# Patient Record
Sex: Male | Born: 1970 | Race: Black or African American | Hispanic: No | Marital: Married | Smoking: Never smoker
Health system: Southern US, Community
[De-identification: ages and names within clinical notes are randomized; demographics above are authoritative.]

## PROBLEM LIST (undated history)

## (undated) DIAGNOSIS — Z789 Other specified health status: Secondary | ICD-10-CM

## (undated) DIAGNOSIS — E119 Type 2 diabetes mellitus without complications: Secondary | ICD-10-CM

## (undated) HISTORY — DX: Other specified health status: Z78.9

## (undated) HISTORY — DX: Type 2 diabetes mellitus without complications: E11.9

## (undated) HISTORY — PX: NO PAST SURGERIES: SHX2092

---

## 2005-08-16 ENCOUNTER — Emergency Department (HOSPITAL_COMMUNITY): Admission: EM | Admit: 2005-08-16 | Discharge: 2005-08-16 | Payer: Self-pay | Admitting: Emergency Medicine

## 2005-12-07 ENCOUNTER — Emergency Department (HOSPITAL_COMMUNITY): Admission: EM | Admit: 2005-12-07 | Discharge: 2005-12-08 | Payer: Self-pay | Admitting: Emergency Medicine

## 2013-06-20 ENCOUNTER — Ambulatory Visit: Payer: Self-pay | Admitting: Family Medicine

## 2013-06-20 VITALS — BP 116/84 | HR 92 | Temp 99.0°F | Resp 18 | Ht 69.5 in | Wt 178.0 lb

## 2013-06-20 DIAGNOSIS — J309 Allergic rhinitis, unspecified: Secondary | ICD-10-CM

## 2013-06-20 DIAGNOSIS — R3911 Hesitancy of micturition: Secondary | ICD-10-CM

## 2013-06-20 MED ORDER — TAMSULOSIN HCL 0.4 MG PO CAPS: 0.4000 mg | ORAL_CAPSULE | Freq: Every day | ORAL | Status: DC

## 2013-06-20 MED ORDER — FLUTICASONE PROPIONATE 50 MCG/ACT NA SUSP: 2.0000 | Freq: Every day | NASAL | Status: DC

## 2013-06-20 NOTE — Progress Notes (Signed)
This chart was scribed for Elvina SidleKurt Lauenstein, MD, by Yevette EdwardsAngela Bracken, ED Scribe. This patient's care was started at 5:51 PM.   Patient ID: Javier [name], male   DOB: 03/25/1970, 43 y.o.   MRN: [id number]      Patient Name: Javier [name] Date of Birth: 12/29/1970 Medical Record Number: [id number] Gender: male Date of Encounter: 06/20/2013  Chief Complaint: Nasal Congestion, Urinary Frequency and Erectile Dysfunction   History of Present Illness:  Javier [name] is a 43 y.o. very pleasant male patient who presents with the following: nasal congestion which has persisted for over a year and has been associated with dyspnea and an increased gag-response. He reports that laying upon his stomach increases his dyspnea. He has had a few episodes of epistaxis.   He also complains of difficulty urinating. The pt also complains of minimal abdominal pain. He denies dysuria.   The pt is from [place].   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    There are no active problems to display for this patient.  History reviewed. No pertinent past medical history. History reviewed. No pertinent past surgical history. History  Substance Use Topics   Smoking status: Never Smoker    Smokeless tobacco: Not on file   Alcohol Use: Not on file   History reviewed. No pertinent family history. No Known Allergies  Medication list has been reviewed and updated.  No current outpatient prescriptions on file prior to visit.   No current facility-administered medications on file prior to visit.    Review of Systems:  Positive: Nasal congestion; SOB; abdominal pain; difficulty urinating Negative: dysuria   Physical Examination: Filed Vitals:   06/20/13 1738  BP: 116/84  Pulse: 92  Temp: 99 F (37.2 C)  Resp: 18   @vitals2 @ Body mass index is 25.92 kg/(m^2). Ideal Body Weight: @FLOWAMB ([id number])@([phone])@  HENT: Mild bilateral nasal passage  swelling. Oropharynx clear.  Neck: Normal neck exam, no adenopathy.  Pulmonary: Chest is clear Cardiovascular: Heart regular, no murmur Abdomen: Abdomen soft and non-tender.   EKG / Labs / Xrays: None available at time of encounter  Assessment and Plan:  Will prescribe pt a nasal spray. Informed pt to follow-up with him in a month to assess efficacy of treatment.    Yevette EdwardsAngela Bracken, ED Scribe  Allergic rhinitis - Plan: fluticasone (FLONASE) 50 MCG/ACT nasal spray  Urinary hesitancy - Plan: tamsulosin (FLOMAX) 0.4 MG CAPS capsule  Signed, Elvina SidleKurt Lauenstein, MD

## 2013-06-20 NOTE — Patient Instructions (Signed)
Allergic Rhinitis Allergic rhinitis is when the mucous membranes in the nose respond to allergens. Allergens are particles in the air that cause your body to have an allergic reaction. This causes you to release allergic antibodies. Through a chain of events, these eventually cause you to release histamine into the blood stream. Although meant to protect the body, it is this release of histamine that causes your discomfort, such as frequent sneezing, congestion, and an itchy, runny nose.  CAUSES  Seasonal allergic rhinitis (hay fever) is caused by pollen allergens that may come from grasses, trees, and weeds. Year-round allergic rhinitis (perennial allergic rhinitis) is caused by allergens such as house dust mites, pet dander, and mold spores.  SYMPTOMS   Nasal stuffiness (congestion).  Itchy, runny nose with sneezing and tearing of the eyes. DIAGNOSIS  Your health care provider can help you determine the allergen or allergens that trigger your symptoms. If you and your health care provider are unable to determine the allergen, skin or blood testing may be used. TREATMENT  Allergic Rhinitis does not have a cure, but it can be controlled by:  Medicines and allergy shots (immunotherapy).  Avoiding the allergen. Hay fever may often be treated with antihistamines in pill or nasal spray forms. Antihistamines block the effects of histamine. There are over-the-counter medicines that may help with nasal congestion and swelling around the eyes. Check with your health care provider before taking or giving this medicine.  If avoiding the allergen or the medicine prescribed do not work, there are many new medicines your health care provider can prescribe. Stronger medicine may be used if initial measures are ineffective. Desensitizing injections can be used if medicine and avoidance does not work. Desensitization is when a patient is given ongoing shots until the body becomes less sensitive to the allergen.  Make sure you follow up with your health care provider if problems continue. HOME CARE INSTRUCTIONS It is not possible to completely avoid allergens, but you can reduce your symptoms by taking steps to limit your exposure to them. It helps to know exactly what you are allergic to so that you can avoid your specific triggers. SEEK MEDICAL CARE IF:   You have a fever.  You develop a cough that does not stop easily (persistent).  You have shortness of breath.  You start wheezing.  Symptoms interfere with normal daily activities. Document Released: 11/20/2000 Document Revised: 12/16/2012 Document Reviewed: 11/02/2012 ExitCare Patient Information 2014 [employer].  

## 2013-06-21 ENCOUNTER — Other Ambulatory Visit: Payer: Self-pay | Admitting: *Deleted

## 2013-06-21 DIAGNOSIS — R3911 Hesitancy of micturition: Secondary | ICD-10-CM

## 2013-06-21 DIAGNOSIS — J309 Allergic rhinitis, unspecified: Secondary | ICD-10-CM

## 2013-06-21 MED ORDER — FLUTICASONE PROPIONATE 50 MCG/ACT NA SUSP: 2.0000 | Freq: Every day | NASAL | Status: DC

## 2013-06-21 MED ORDER — TAMSULOSIN HCL 0.4 MG PO CAPS: 0.4000 mg | ORAL_CAPSULE | Freq: Every day | ORAL | Status: DC

## 2013-09-06 ENCOUNTER — Ambulatory Visit (INDEPENDENT_AMBULATORY_CARE_PROVIDER_SITE_OTHER): Payer: Self-pay | Admitting: Emergency Medicine

## 2013-09-06 ENCOUNTER — Telehealth: Payer: Self-pay | Admitting: Emergency Medicine

## 2013-09-06 VITALS — BP 118/76 | HR 68 | Temp 98.8°F | Resp 16 | Ht 69.75 in | Wt 181.6 lb

## 2013-09-06 DIAGNOSIS — J309 Allergic rhinitis, unspecified: Secondary | ICD-10-CM

## 2013-09-06 DIAGNOSIS — N529 Male erectile dysfunction, unspecified: Secondary | ICD-10-CM

## 2013-09-06 DIAGNOSIS — R3911 Hesitancy of micturition: Secondary | ICD-10-CM

## 2013-09-06 MED ORDER — TAMSULOSIN HCL 0.4 MG PO CAPS: 0.4000 mg | ORAL_CAPSULE | Freq: Every day | ORAL | Status: DC

## 2013-09-06 MED ORDER — FLUTICASONE PROPIONATE 50 MCG/ACT NA SUSP: 2.0000 | Freq: Every day | NASAL | Status: DC

## 2013-09-06 MED ORDER — VARDENAFIL HCL 10 MG PO TBDP: ORAL_TABLET | ORAL | Status: DC

## 2013-09-06 NOTE — Patient Instructions (Signed)
Erectile Dysfunction Erectile dysfunction is the inability to get or sustain a good enough erection to have sexual intercourse. Erectile dysfunction may involve:  Inability to get an erection.  Lack of enough hardness to allow penetration.  Loss of the erection before sex is finished.  Premature ejaculation. CAUSES  Certain drugs, such as:  Pain relievers.  Antihistamines.  Antidepressants.  Blood pressure medicines.  Water pills (diuretics).  Ulcer medicines.  Muscle relaxants.  Illegal drugs.  Excessive drinking.  Psychological causes, such as:  Anxiety.  Depression.  Sadness.  Exhaustion.  Performance fear.  Stress.  Physical causes, such as:  Artery problems. This may include diabetes, smoking, liver disease, or atherosclerosis.  High blood pressure.  Hormonal problems, such as low testosterone.  Obesity.  Nerve problems. This may include back or pelvic injuries, diabetes mellitus, multiple sclerosis, or Parkinson disease. SYMPTOMS  Inability to get an erection.  Lack of enough hardness to allow penetration.  Loss of the erection before sex is finished.  Premature ejaculation.  Normal erections at some times, but with frequent unsatisfactory episodes.  Orgasms that are not satisfactory in sensation or frequency.  Low sexual satisfaction in either partner because of erection problems.  A curved penis occurring with erection. The curve may cause pain or may be too curved to allow for intercourse.  Never having nighttime erections. DIAGNOSIS Your caregiver can often diagnose this condition by:  Performing a physical exam to find other diseases or specific problems with the penis.  Asking you detailed questions about the problem.  Performing blood tests to check for diabetes mellitus or to measure hormone levels.  Performing urine tests to find other underlying health conditions.  Performing an ultrasound exam to check for  scarring.  Performing a test to check blood flow to the penis.  Doing a sleep study at home to measure nighttime erections. TREATMENT   You may be prescribed medicines by mouth.  You may be given medicine injections into the penis.  You may be prescribed a vacuum pump with a ring.  Penile implant surgery may be performed. You may receive:  An inflatable implant.  A semirigid implant.  Blood vessel surgery may be performed. HOME CARE INSTRUCTIONS  If you are prescribed oral medicine, you should take the medicine as prescribed. Do not increase the dosage without first discussing it with your physician.  If you are using self-injections, be careful to avoid any veins that are on the surface of the penis. Apply pressure to the injection site for 5 minutes.  If you are using a vacuum pump, make sure you have read the instructions before using it. Discuss any questions with your physician before taking the pump home. SEEK MEDICAL CARE IF:  You experience pain that is not responsive to the pain medicine you have been prescribed.  You experience nausea or vomiting. SEEK IMMEDIATE MEDICAL CARE IF:   When taking oral or injectable medications, you experience an erection that lasts longer than 4 hours. If your physician is unavailable, go to the nearest emergency room for evaluation. An erection that lasts much longer than 4 hours can result in permanent damage to your penis.  You have pain that is severe.  You develop redness, severe pain, or severe swelling of your penis.  You have redness spreading up into your groin or lower abdomen.  You are unable to pass your urine. Document Released: 02/23/2000 Document Revised: 10/28/2012 Document Reviewed: 07/30/2012 ExitCare Patient Information 2015 [employer]. This information is not   intended to replace advice given to you by your health care provider. Make sure you discuss any questions you have with your health care provider.  

## 2013-09-06 NOTE — Telephone Encounter (Signed)
States that he is at the pharmacy now and his Levitra is going to cost $300. States that there is a coupon that Dr. Dareen PianoAnderson mentioned to him that help lower the cost. Please advise   [phone]

## 2013-09-06 NOTE — Progress Notes (Signed)
[facility] [address], [place] [id number] [phone]  Date:  09/06/2013   Name:  Bernell ListHarouna Stantz   DOB:  06/18/1970   MRN:  [id number]  PCP:  No PCP Per Patient    Chief Complaint: Nasal Congestion   History of Present Illness:  Bernell ListHarouna Cantrall is a 43 y.o. very pleasant male patient who presents with the following:  Multiple issues.  Was treated in April for prostatism and seasonal allergic rhinitis.  Misunderstood the intent with the medication and did not refill them after the first month.  Say he is still having difficulties with allergic rhinitis symptoms. His prostatism is improved, sleeping the night thru without nocturia.  He has improved flow and decreased hesitancy.   Cannot achieve an erection suitable to complete intercourse. Denies other complaint or health concern today.   There are no active problems to display for this patient.   History reviewed. No pertinent past medical history.  History reviewed. No pertinent past surgical history.  History  Substance Use Topics  . Smoking status: Never Smoker   . Smokeless tobacco: Not on file  . Alcohol Use: Not on file    History reviewed. No pertinent family history.  No Known Allergies  Medication list has been reviewed and updated.  Current Outpatient Prescriptions on File Prior to Visit  Medication Sig Dispense Refill  . fluticasone (FLONASE) 50 MCG/ACT nasal spray Place 2 sprays into both nostrils daily.  16 g  6  . tamsulosin (FLOMAX) 0.4 MG CAPS capsule Take 1 capsule (0.4 mg total) by mouth daily.  30 capsule  3   No current facility-administered medications on file prior to visit.    Review of Systems:  As per HPI, otherwise negative.    Physical Examination: Filed Vitals:   09/06/13 1033  BP: 118/76  Pulse: 68  Temp: 98.8 F (37.1 C)  Resp: 16   Filed Vitals:   09/06/13 1033  Height: 5' 9.75" (1.772 m)  Weight: 181 lb 9.6 oz (82.373 kg)   Body mass index is  26.23 kg/(m^2). Ideal Body Weight: Weight in (lb) to have BMI = 25: 172.6   GEN: WDWN, NAD, Non-toxic, Alert & Oriented x 3 HEENT: Atraumatic, Normocephalic.  Ears and Nose: No external deformity. EXTR: No clubbing/cyanosis/edema NEURO: Normal gait.  PSYCH: Normally interactive. Conversant. Not depressed or anxious appearing.  Calm demeanor.    Assessment and Plan: ED Prostatism SAR   Signed,  Phillips OdorJeffery Anderson, MD

## 2013-09-07 NOTE — Telephone Encounter (Signed)
Advised pt to go to [email or web address] to print out the instant savings information. Pt states he understands.

## 2013-09-20 ENCOUNTER — Telehealth: Payer: Self-pay

## 2013-09-20 NOTE — Telephone Encounter (Signed)
Spoke to [employer]ara H. Regarding patient's message.  Then called patient and advised he does not try to purchase the medication online because he could get something other than what was prescribed to him.  He said he would keep trying pharmacies in the area until he found one with a generic.

## 2013-09-20 NOTE — Telephone Encounter (Signed)
Patient says none of the pharmacies he has tried have generic levaquin, he wants to know if he can purchase it online.

## 2014-05-09 ENCOUNTER — Ambulatory Visit (INDEPENDENT_AMBULATORY_CARE_PROVIDER_SITE_OTHER): Payer: PRIVATE HEALTH INSURANCE | Admitting: Family Medicine

## 2014-05-09 VITALS — BP 118/84 | HR 66 | Temp 97.9°F | Resp 18 | Ht 70.5 in | Wt 181.2 lb

## 2014-05-09 DIAGNOSIS — J3089 Other allergic rhinitis: Secondary | ICD-10-CM

## 2014-05-09 MED ORDER — PREDNISONE 20 MG PO TABS: ORAL_TABLET | ORAL | Status: DC

## 2014-05-09 MED ORDER — IPRATROPIUM BROMIDE 0.03 % NA SOLN: 2.0000 | Freq: Two times a day (BID) | NASAL | Status: DC

## 2014-05-09 NOTE — Patient Instructions (Signed)

## 2014-05-09 NOTE — Progress Notes (Signed)
This chart was scribed for Elvina SidleKurt Emidio Warrell, MD by Luisa DagoPriscilla Tutu, ED Scribe. This patient was seen in room 9 and the patient's care was started at 4:07 PM.  Patient ID: Javier [name] MRN: [id number], DOB: 04/21/1970, 44 y.o. Date of Encounter: 05/09/2014, 4:07 PM  Primary Physician: No PCP Per Patient  Chief Complaint: Nasal congestion  HPI: 44 y.o. year old male with history below presents with year long intermittent nasal congestion. Pt states that he was prescribed a nasal spray upon his last visit but it has not been working to alleviate his symptom. He states that when he woke up this morning, after blowing his nose he noticed some blood tinted sputum. Mr Arrie Aranntchi is also complaining of 6 month teary eye as well. Pt denies any  fever, neck pain, sore throat,  CP, cough, SOB, abdominal pain, nausea, emesis, diarrhea, urinary symptoms, back pain, HA, weakness, numbness and rash as associated symptoms.    Patient works in Set designermanufacturing  History reviewed. No pertinent past medical history.   Home Meds: Prior to Admission medications   Medication Sig Start Date End Date Taking? Authorizing Provider  fluticasone (FLONASE) 50 MCG/ACT nasal spray Place 2 sprays into both nostrils daily. Patient not taking: Reported on 05/09/2014 09/06/13   Carmelina DaneJeffery S Anderson, MD  tamsulosin (FLOMAX) 0.4 MG CAPS capsule Take 1 capsule (0.4 mg total) by mouth daily. Patient not taking: Reported on 05/09/2014 09/06/13   Carmelina DaneJeffery S Anderson, MD  Vardenafil HCl 10 MG TBDP 1 tablet po 30 minutes prior to intercourse. Patient not taking: Reported on 05/09/2014 09/06/13   Carmelina DaneJeffery S Anderson, MD    Allergies: No Known Allergies  History   Social History  . Marital Status: Married    Spouse Name: N/A  . Number of Children: N/A  . Years of Education: N/A   Occupational History  . Not on file.   Social History Main Topics  . Smoking status: Never Smoker   . Smokeless tobacco: Not on file  . Alcohol Use: Not on  file  . Drug Use: Not on file  . Sexual Activity: Not on file   Other Topics Concern  . Not on file   Social History Narrative     Review of Systems: Positive nasal congestion and watery eyes Constitutional: negative for chills, fever, night sweats, weight changes, or fatigue  HEENT: negative for vision changes, hearing loss,  rhinorrhea, ST, epistaxis, or sinus pressure Cardiovascular: negative for chest pain or palpitations Respiratory: negative for hemoptysis, wheezing, shortness of breath, or cough Abdominal: negative for abdominal pain, nausea, vomiting, diarrhea, or constipation Dermatological: negative for rash Neurologic: negative for headache, dizziness, or syncope All other systems reviewed and are otherwise negative with the exception to those above and in the HPI.   Physical Exam:  Blood pressure 118/84, pulse 66, temperature 97.9 F (36.6 C), temperature source Oral, resp. rate 18, height 5' 10.5" (1.791 m), weight 181 lb 3.2 oz (82.192 kg), SpO2 100 %., Body mass index is 25.62 kg/(m^2). General: Well developed, well nourished, in no acute distress. Head: Normocephalic, atraumatic, eyes without discharge, sclera non-icteric, nares are without discharge. Bilateral auditory canals clear, TM's are without perforation, pearly grey and translucent with reflective cone of light bilaterally. Clear rhinorrhea with mildly swollen nasal passage. OP is clear. No erythema or edema.  Neck: Supple. No thyromegaly. Full ROM. No lymphadenopathy. Lungs: Clear bilaterally to auscultation without wheezes, rales, or rhonchi. Breathing is unlabored. Heart: RRR with S1 S2. No murmurs, rubs, or  gallops appreciated. Abdomen: Soft, non-tender, non-distended with normoactive bowel sounds. No hepatomegaly. No rebound/guarding. No obvious abdominal masses. Msk:  Strength and tone normal for age. Extremities/Skin: Warm and dry. No clubbing or cyanosis. No edema. No rashes or suspicious  lesions. Neuro: Alert and oriented X 3. Moves all extremities spontaneously. Gait is normal. CNII-XII grossly in tact. Psych:  Responds to questions appropriately with a normal affect.    ASSESSMENT AND PLAN:  44 y.o. year old male with  This chart was scribed in my presence and reviewed by me personally.    ICD-9-CM ICD-10-CM   1. Other allergic rhinitis 477.8 J30.89 ipratropium (ATROVENT) 0.03 % nasal spray     predniSONE (DELTASONE) 20 MG tablet     Signed, Elvina Sidle, MD    Signed, Elvina Sidle, MD 05/09/2014 4:07 PM

## 2015-05-22 ENCOUNTER — Ambulatory Visit (INDEPENDENT_AMBULATORY_CARE_PROVIDER_SITE_OTHER): Payer: BLUE CROSS/BLUE SHIELD | Admitting: Physician Assistant

## 2015-05-22 VITALS — BP 116/92 | HR 91 | Temp 98.4°F | Resp 17 | Ht 70.5 in | Wt 182.0 lb

## 2015-05-22 DIAGNOSIS — R030 Elevated blood-pressure reading, without diagnosis of hypertension: Secondary | ICD-10-CM

## 2015-05-22 DIAGNOSIS — Z13220 Encounter for screening for lipoid disorders: Secondary | ICD-10-CM | POA: Diagnosis not present

## 2015-05-22 DIAGNOSIS — J309 Allergic rhinitis, unspecified: Secondary | ICD-10-CM

## 2015-05-22 DIAGNOSIS — IMO0001 Reserved for inherently not codable concepts without codable children: Secondary | ICD-10-CM

## 2015-05-22 LAB — COMPREHENSIVE METABOLIC PANEL
ALT: 20 U/L (ref 9–46)
AST: 17 U/L (ref 10–40)
Albumin: 4.7 g/dL (ref 3.6–5.1)
Alkaline Phosphatase: 42 U/L (ref 40–115)
BUN: 9 mg/dL (ref 7–25)
CHLORIDE: 103 mmol/L (ref 98–110)
CO2: 28 mmol/L (ref 20–31)
CREATININE: 0.86 mg/dL (ref 0.60–1.35)
Calcium: 9.9 mg/dL (ref 8.6–10.3)
GLUCOSE: 96 mg/dL (ref 65–99)
POTASSIUM: 5 mmol/L (ref 3.5–5.3)
SODIUM: 140 mmol/L (ref 135–146)
Total Bilirubin: 0.5 mg/dL (ref 0.2–1.2)
Total Protein: 8.1 g/dL (ref 6.1–8.1)

## 2015-05-22 LAB — CBC
HCT: 47.8 % (ref 39.0–52.0)
Hemoglobin: 16.6 g/dL (ref 13.0–17.0)
MCH: 32.4 pg (ref 26.0–34.0)
MCHC: 34.7 g/dL (ref 30.0–36.0)
MCV: 93.2 fL (ref 78.0–100.0)
MPV: 11.6 fL (ref 8.6–12.4)
PLATELETS: 275 10*3/uL (ref 150–400)
RBC: 5.13 MIL/uL (ref 4.22–5.81)
RDW: 13.3 % (ref 11.5–15.5)
WBC: 5.7 10*3/uL (ref 4.0–10.5)

## 2015-05-22 LAB — LIPID PANEL
CHOL/HDL RATIO: 3.4 ratio (ref <=5.0)
Cholesterol: 212 mg/dL — ABNORMAL HIGH (ref 125–200)
HDL: 63 mg/dL (ref >=40)
LDL CALC: 134 mg/dL — AB (ref <130)
Triglycerides: 76 mg/dL (ref <150)
VLDL: 15 mg/dL (ref <30)

## 2015-05-22 MED ORDER — CETIRIZINE HCL 10 MG PO TABS: 10.0000 mg | ORAL_TABLET | Freq: Every day | ORAL | Status: DC

## 2015-05-22 NOTE — Patient Instructions (Addendum)
IF you received an x-ray today, you will receive an invoice from [facility]. Please contact [facility] at [phone] with questions or concerns regarding your invoice.   IF you received labwork today, you will receive an invoice from [facility]. Please contact [facility] at [phone] with questions or concerns regarding your invoice.   Our billing staff will not be able to assist you with questions regarding bills from these companies.  You will be contacted with the lab results as soon as they are available. The fastest way to get your results is to activate your My Chart account. Instructions are located on the last page of this paperwork. If you have not heard from us regarding the results in 2 weeks, please contact this office.   I will call you with your lab results. Work on healthier eating - avoid fried and salty foods. Eat vegetables and fruits. Only drink water during the day. Exercise 3-4 days a week for at least 30 minutes at a time. Buy a BP monitor and take bp at home at rest 3-4 times a week and keep a record. Return in 3 months for follow up on your BP.  Take zyrtec daily for allergies. Take at night if makes you drowsy.

## 2015-05-22 NOTE — Progress Notes (Signed)
[facility] [address] [place] [address] [phone]  Date:  05/22/2015   Name:  Javier [name]   DOB:  01/15/1971   MRN:  [id number]  PCP:  No PCP Per Patient    Chief Complaint: Sinus Congestion and Blood Pressure Check   History of Present Illness:  This is a 45 y.o. male with PMH allergic rhinitis who is presenting with sinus congestion for several years. States he has the congestion year round. He also has watery eyes, esp in the morning. Eyes are not irritated and do not itch. He has tried atrovent nasal spray and flonase, each for 3 weeks at a time, and states neither worked. He has never tried oral antihistamines or other allergy medicine. Pt wondering if an abx might help him.  Pt also wondering about his BP. States gets checked at work occ and 130s/90s. Today 117/92. He does not eat well. Does not eat breakfast. For lunch has fried chicken. At dinner has rice and vegetables from a [employer]. Drinks only water. Has 4-5 alcoholic beverages one weekend day a week. Does not smoke. No rec drug use. Does not exercise regularly.  Review of Systems:  Review of Systems See HPI  There are no active problems to display for this patient.  Home meds: None  No Known Allergies  History reviewed. No pertinent past surgical history.  Social History  Substance Use Topics  . Smoking status: Never Smoker   . Smokeless tobacco: None  . Alcohol Use: None    History reviewed. No pertinent family history.  Medication list has been reviewed and updated.  Physical Examination:  Physical Exam  Constitutional: He is oriented to person, place, and time. He appears well-developed and well-nourished. No distress.  HENT:  Head: Normocephalic and atraumatic.  Right Ear: Hearing, external ear and ear canal normal. Tympanic membrane is retracted.  Left Ear: Hearing, external ear and ear canal normal. Tympanic membrane is retracted.  Nose: Mucosal  edema present. Right sinus exhibits no maxillary sinus tenderness and no frontal sinus tenderness. Left sinus exhibits no maxillary sinus tenderness and no frontal sinus tenderness.  Mouth/Throat: Uvula is midline, oropharynx is clear and moist and mucous membranes are normal.  Eyes: Conjunctivae and lids are normal. Right eye exhibits no discharge. Left eye exhibits no discharge. No scleral icterus.  Neck: Carotid bruit is not present. No thyromegaly present.  Cardiovascular: Normal rate, regular rhythm, normal heart sounds and normal pulses.   No murmur heard. Pulmonary/Chest: Effort normal and breath sounds normal. No respiratory distress. He has no wheezes. He has no rhonchi. He has no rales.  Musculoskeletal: Normal range of motion.  Lymphadenopathy:       Head (right side): No submental, no submandibular and no tonsillar adenopathy present.       Head (left side): No submental, no submandibular and no tonsillar adenopathy present.    He has no cervical adenopathy.  Neurological: He is alert and oriented to person, place, and time.  Skin: Skin is warm, dry and intact. No lesion and no rash noted.  Psychiatric: He has a normal mood and affect. His speech is normal and behavior is normal. Thought content normal.   BP 116/92 mmHg  Pulse 91  Temp(Src) 98.4 F (36.9 C) (Oral)  Resp 17  Ht 5' 10.5" (1.791 m)  Wt 182 lb (82.555 kg)  BMI 25.74 kg/m2  SpO2 95%  Assessment and Plan:  1. Elevated BP Diastolic only mildly elevated  to 90-92. Systolic 116-120. Will hold off on starting meds at this time. Pt does not exercise and does not eat a healthy diet. We discussed lifestyle changes. He will buy a bp monitor and monitor/record pressures 3-4 times a week. Return in 3 months for follow up. - CBC - Comprehensive metabolic panel  2. Lipid screening - Lipid panel  3. Allergic rhinitis, unspecified allergic rhinitis type Symptoms consistent with allergic rhinitis. Not currently taking any  meds. Start on zyrtec QD. Follow up in 3 months. - cetirizine (ZYRTEC) 10 MG tablet; Take 1 tablet (10 mg total) by mouth daily.  Dispense: 30 tablet; Refill: 11   Jone Panebianco V. Dyke BrackettBush, PA-C, MHS [facility]  05/22/2015

## 2015-06-01 ENCOUNTER — Encounter: Payer: Self-pay | Admitting: Family Medicine

## 2015-09-13 ENCOUNTER — Ambulatory Visit (INDEPENDENT_AMBULATORY_CARE_PROVIDER_SITE_OTHER): Payer: BLUE CROSS/BLUE SHIELD | Admitting: Physician Assistant

## 2015-09-13 VITALS — BP 138/82 | HR 100 | Temp 98.4°F | Resp 18 | Ht 70.5 in | Wt 181.0 lb

## 2015-09-13 DIAGNOSIS — J309 Allergic rhinitis, unspecified: Secondary | ICD-10-CM | POA: Diagnosis not present

## 2015-09-13 MED ORDER — MONTELUKAST SODIUM 10 MG PO TABS: 10.0000 mg | ORAL_TABLET | Freq: Every day | ORAL | Status: DC

## 2015-09-13 MED ORDER — OLOPATADINE HCL 0.1 % OP SOLN: 1.0000 [drp] | Freq: Two times a day (BID) | OPHTHALMIC | Status: DC

## 2015-09-13 MED ORDER — MOMETASONE FUROATE 50 MCG/ACT NA SUSP: 2.0000 | Freq: Every day | NASAL | Status: DC

## 2015-09-13 NOTE — Progress Notes (Signed)
Javier ListHarouna [name]  MRN: [id number] DOB: 09/20/1970  Subjective:  Pt presents to clinic for continued problems with nasal congestion and am watery eyes.  He has had this for over a year and he has been on Flonase, atrovent and zyrtec none of which have helped.  He is frustrated because he starts to feel better after he has not been a work for a few days in a row and then once he goes back to work the symptoms return.  He is interested in having a few days off of work to "feel alive".  He works with a lot of chemicals and he is not given a mask at work to wear unless they are having an inspection.  His symptoms started when he got this job.  Symptoms do not change when the seasons change.  Patient Active Problem [name]   Diagnosis Date Noted  . Rhinitis, allergic 05/22/2015  . Elevated BP 05/22/2015    No current outpatient prescriptions on file prior to visit.   No current facility-administered medications on file prior to visit.    No Known Allergies  Review of Systems  HENT: Positive for congestion. Negative for nosebleeds, postnasal drip and sore throat.   Eyes: Positive for discharge (watery only in the am). Negative for pain, redness, itching and visual disturbance.  Allergic/Immunologic: Positive for environmental allergies.   Objective:  BP 138/82 mmHg  Pulse 100  Temp(Src) 98.4 F (36.9 C) (Oral)  Resp 18  Ht 5' 10.5" (1.791 m)  Wt 181 lb (82.101 kg)  BMI 25.60 kg/m2  SpO2 97%  Physical Exam  Constitutional: He is oriented to person, place, and time and well-developed, well-nourished, and in no distress.  HENT:  Head: Normocephalic and atraumatic.  Right Ear: Hearing, tympanic membrane, external ear and ear canal normal.  Left Ear: Hearing, tympanic membrane, external ear and ear canal normal.  Nose: Mucosal edema (pale) present.  Mouth/Throat: Uvula is midline, oropharynx is clear and moist and mucous membranes are normal.  Eyes: Conjunctivae and lids are normal.    Bilateral pale conjunctiva with cobblestoning  Neck: Normal range of motion.  Cardiovascular: Normal rate, regular rhythm and normal heart sounds.   Pulmonary/Chest: Effort normal and breath sounds normal. He has no wheezes.  Lymphadenopathy:       Head (right side): No tonsillar adenopathy present.       Head (left side): No tonsillar adenopathy present.    He has no cervical adenopathy.       Right: No supraclavicular adenopathy present.       Left: No supraclavicular adenopathy present.  Neurological: He is alert and oriented to person, place, and time. Gait normal.  Skin: Skin is warm and dry.  Psychiatric: Mood, memory, affect and judgment normal.    Assessment and Plan :  Allergic rhinitis, unspecified allergic rhinitis type - Plan: mometasone (NASONEX) 50 MCG/ACT nasal spray, montelukast (SINGULAIR) 10 MG tablet, olopatadine (PATANOL) 0.1 % ophthalmic solution, Care order/instruction   Pt seems to be having an allergic response to his work environment - we will change to different medications than he has used in the past - he was also given a note to require him to have a mask at work to prevent the chemical exposure.  He will recheck in a month in hopes that he is controlled and medications can start to be tapered off.  He understands and agrees with the plan.  Benny LennertSarah Weber PA-C  [facility] 09/13/2015 3:28 PM

## 2015-09-13 NOTE — Patient Instructions (Addendum)
Wear a mask at work.  We are going to start a nose spray to block the allergic reaction in the nose. We will start a pill that will also stop the allergic reaction that is going on in your nose.    IF you received an x-ray today, you will receive an invoice from [facility]. Please contact [facility] at [phone] with questions or concerns regarding your invoice.   IF you received labwork today, you will receive an invoice from [facility]. Please contact [facility] at [phone] with questions or concerns regarding your invoice.   Our billing staff will not be able to assist you with questions regarding bills from these companies.  You will be contacted with the lab results as soon as they are available. The fastest way to get your results is to activate your My Chart account. Instructions are located on the last page of this paperwork. If you have not heard from us regarding the results in 2 weeks, please contact this office.

## 2015-11-17 ENCOUNTER — Ambulatory Visit (INDEPENDENT_AMBULATORY_CARE_PROVIDER_SITE_OTHER): Payer: BLUE CROSS/BLUE SHIELD | Admitting: Urgent Care

## 2015-11-17 VITALS — BP 104/76 | HR 82 | Temp 98.4°F | Resp 16 | Ht 69.0 in | Wt 174.8 lb

## 2015-11-17 DIAGNOSIS — J309 Allergic rhinitis, unspecified: Secondary | ICD-10-CM

## 2015-11-17 DIAGNOSIS — J029 Acute pharyngitis, unspecified: Secondary | ICD-10-CM | POA: Diagnosis not present

## 2015-11-17 LAB — POCT RAPID STREP A (OFFICE): Rapid Strep A Screen: NEGATIVE

## 2015-11-17 MED ORDER — CETIRIZINE HCL 10 MG PO TABS: 10.0000 mg | ORAL_TABLET | Freq: Every day | ORAL | 11 refills | Status: DC

## 2015-11-17 MED ORDER — METHYLPREDNISOLONE ACETATE 80 MG/ML IJ SUSP
80.0000 mg | Freq: Once | INTRAMUSCULAR | Status: AC
  Administered 2015-11-17: 80 mg via INTRAMUSCULAR

## 2015-11-17 MED ORDER — PSEUDOEPHEDRINE HCL ER 120 MG PO TB12: 120.0000 mg | ORAL_TABLET | Freq: Two times a day (BID) | ORAL | 3 refills | Status: DC

## 2015-11-17 NOTE — Patient Instructions (Addendum)
Allergic Rhinitis Allergic rhinitis is when the mucous membranes in the nose respond to allergens. Allergens are particles in the air that cause your body to have an allergic reaction. This causes you to release allergic antibodies. Through a chain of events, these eventually cause you to release histamine into the blood stream. Although meant to protect the body, it is this release of histamine that causes your discomfort, such as frequent sneezing, congestion, and an itchy, runny nose.  CAUSES Seasonal allergic rhinitis (hay fever) is caused by pollen allergens that may come from grasses, trees, and weeds. Year-round allergic rhinitis (perennial allergic rhinitis) is caused by allergens such as house dust mites, pet dander, and mold spores. SYMPTOMS  Nasal stuffiness (congestion).  Itchy, runny nose with sneezing and tearing of the eyes. DIAGNOSIS Your health care provider can help you determine the allergen or allergens that trigger your symptoms. If you and your health care provider are unable to determine the allergen, skin or blood testing may be used. Your health care provider will diagnose your condition after taking your health history and performing a physical exam. Your health care provider may assess you for other related conditions, such as asthma, pink eye, or an ear infection. TREATMENT Allergic rhinitis does not have a cure, but it can be controlled by:  Medicines that block allergy symptoms. These may include allergy shots, nasal sprays, and oral antihistamines.  Avoiding the allergen. Hay fever may often be treated with antihistamines in pill or nasal spray forms. Antihistamines block the effects of histamine. There are over-the-counter medicines that may help with nasal congestion and swelling around the eyes. Check with your health care provider before taking or giving this medicine. If avoiding the allergen or the medicine prescribed do not work, there are many new medicines  your health care provider can prescribe. Stronger medicine may be used if initial measures are ineffective. Desensitizing injections can be used if medicine and avoidance does not work. Desensitization is when a patient is given ongoing shots until the body becomes less sensitive to the allergen. Make sure you follow up with your health care provider if problems continue. HOME CARE INSTRUCTIONS It is not possible to completely avoid allergens, but you can reduce your symptoms by taking steps to limit your exposure to them. It helps to know exactly what you are allergic to so that you can avoid your specific triggers. SEEK MEDICAL CARE IF:  You have a fever.  You develop a cough that does not stop easily (persistent).  You have shortness of breath.  You start wheezing.  Symptoms interfere with normal daily activities.   This information is not intended to replace advice given to you by your health care provider. Make sure you discuss any questions you have with your health care provider.   Document Released: 11/20/2000 Document Revised: 03/18/2014 Document Reviewed: 11/02/2012 Elsevier Interactive Patient Education 2016 Elsevier Inc.     IF you received an x-ray today, you will receive an invoice from [facility]. Please contact [facility] at [phone] with questions or concerns regarding your invoice.   IF you received labwork today, you will receive an invoice from [facility]. Please contact [facility] at [phone] with questions or concerns regarding your invoice.   Our billing staff will not be able to assist you with questions regarding bills from these companies.  You will be contacted with the lab results as soon as they are available. The fastest way to get your results is to   activate your My Chart account. Instructions are located on the last page of this paperwork. If you have not heard from us regarding the results in 2 weeks,  please contact this office.      

## 2015-11-17 NOTE — Progress Notes (Signed)
    MRN: [id number] DOB: 11/18/1970  Subjective:   Javier [name] is a 45 y.o. male presenting for chief complaint of Sore Throat (x 4 days and insomnia for 4-5 days) and Nasal Congestion  Reports several month history of persistent nasal congestion, sinus pressure. He has been seen here several times for the same issue. Has tried multiple allergy medications including antihistamine, nasal steroid, Singulair and states that these medications have not helped. He has stopped taking any of them. Reports he is now having worsening sore throat for the past 4 days, difficulty swallowing, subjective fever. Denies chest pain, wheezing, shob, rashes.  Javier [name] has a current medication list which includes the following prescription(s): mometasone, montelukast, and olopatadine. Also has No Known Allergies.  Javier [name]  has no past medical history on file. Also  has no past surgical history on file.  Objective:   Vitals: BP 104/76 (BP Location: Left Arm, Patient Position: Sitting, Cuff Size: Normal)   Pulse 82   Temp 98.4 F (36.9 C) (Oral)   Resp 16   Ht 5\' 9"  (1.753 m)   Wt 174 lb 12.8 oz (79.3 kg)   SpO2 99%   BMI 25.81 kg/m   Physical Exam  Constitutional: He is oriented to person, place, and time. He appears well-developed and well-nourished.  HENT:  TM's flat bilaterally but no effusions or erythema. Nasal turbinates boggy and edematous without sinus tenderness. Postnasal drip present, also has tonsillar erythema and tonsilliths left side without exudates or abscesses.  Eyes: Right eye exhibits no discharge. Left eye exhibits no discharge. No scleral icterus.  Neck: Normal range of motion. Neck supple.  Cardiovascular: Normal rate, regular rhythm and intact distal pulses.  Exam reveals no gallop and no friction rub.   No murmur heard. Pulmonary/Chest: No respiratory distress. He has no wheezes. He has no rales.  Lymphadenopathy:    He has no cervical adenopathy.  Neurological: He is alert  and oriented to person, place, and time.  Skin: Skin is warm and dry.   Results for orders placed or performed in visit on 11/17/15 (from the past 24 hour(s))  POCT rapid strep A     Status: None   Collection Time: 11/17/15  3:30 PM  Result Value Ref Range   Rapid Strep A Screen Negative Negative    Assessment and Plan :   1. Allergic rhinitis, unspecified allergic rhinitis type 2. Sore throat - IM Depomedrol today for better allergy control. Start aggressive allergy management. Consider referral to allergist or ENT if no improvement in 2-4 weeks.  Wallis BambergMario Devika Dragovich, PA-C [facility] [phone] 11/17/2015 3:13 PM

## 2015-11-19 LAB — CULTURE, GROUP A STREP: Organism ID, Bacteria: NORMAL

## 2015-11-21 ENCOUNTER — Encounter: Payer: Self-pay | Admitting: Urgent Care

## 2015-12-01 ENCOUNTER — Ambulatory Visit (INDEPENDENT_AMBULATORY_CARE_PROVIDER_SITE_OTHER): Payer: BLUE CROSS/BLUE SHIELD | Admitting: Family Medicine

## 2015-12-01 VITALS — BP 120/84 | HR 85 | Temp 98.9°F | Resp 16 | Ht 70.0 in | Wt 178.0 lb

## 2015-12-01 DIAGNOSIS — IMO0002 Reserved for concepts with insufficient information to code with codable children: Secondary | ICD-10-CM

## 2015-12-01 DIAGNOSIS — Z7189 Other specified counseling: Secondary | ICD-10-CM | POA: Diagnosis not present

## 2015-12-01 DIAGNOSIS — Z23 Encounter for immunization: Secondary | ICD-10-CM | POA: Diagnosis not present

## 2015-12-01 MED ORDER — TYPHOID VACCINE PO CPDR: 1.0000 | DELAYED_RELEASE_CAPSULE | ORAL | 0 refills | Status: DC

## 2015-12-01 MED ORDER — DOXYCYCLINE HYCLATE 100 MG PO CAPS: 100.0000 mg | ORAL_CAPSULE | Freq: Every day | ORAL | 0 refills | Status: DC

## 2015-12-01 NOTE — Progress Notes (Signed)
Patient ID: Javier [name], male    DOB: 02/12/1971, 45 y.o.   MRN: [id number]  PCP: No PCP Per Patient  Chief Complaint  Patient presents with  . Immunizations    for travel, [place]     Subjective:   HPI Presents for international travel immunization.  Will be traveling to [place], [place] on Tuesday and expects to be out of the country 3-4 weeks.  Today he requests prophylaxis for malaria, typhoid, and TDAP vaccination. He refuses influenza and reports that he received his yellow fever immunization two years prior. He is uncertain regarding his childhood vaccinations.  Social History   Social History  . Marital status: Married    Spouse name: N/A  . Number of children: 3  . Years of education: N/A   Occupational History  . Not on file.   Social History Main Topics  . Smoking status: Never Smoker  . Smokeless tobacco: Never Used  . Alcohol use Not on file  . Drug use: Unknown  . Sexual activity: Not on file   Other Topics Concern  . Not on file   Social History Narrative   Married with 3 children   Works Financial tradercleaning medical equipment   No family history on file.  There is no immunization history on file for this patient.  Review of Systems See HPI  Patient Active Problem List   Diagnosis Date Noted  . Rhinitis, allergic 05/22/2015  . Elevated BP 05/22/2015     Prior to Admission medications   Medication Sig Start Date End Date Taking? Authorizing Provider  cetirizine (ZYRTEC) 10 MG tablet Take 1 tablet (10 mg total) by mouth daily. 11/17/15  Yes Wallis BambergMario Mani, PA-C  mometasone (NASONEX) 50 MCG/ACT nasal spray Place 2 sprays into the nose daily. 09/13/15  Yes Morrell RiddleSarah L Weber, PA-C  montelukast (SINGULAIR) 10 MG tablet Take 1 tablet (10 mg total) by mouth at bedtime. 09/13/15  Yes Morrell RiddleSarah L Weber, PA-C  olopatadine (PATANOL) 0.1 % ophthalmic solution Place 1 drop into both eyes 2 (two) times daily. 09/13/15  Yes Morrell RiddleSarah L Weber, PA-C  pseudoephedrine (SUDAFED 12  HOUR) 120 MG 12 hr tablet Take 1 tablet (120 mg total) by mouth 2 (two) times daily. Patient not taking: Reported on 12/01/2015 11/17/15   Wallis BambergMario Mani, PA-C   No Known Allergies     Objective:  Physical Exam  Constitutional: He is oriented to person, place, and time. He appears well-developed and well-nourished.  HENT:  Head: Normocephalic and atraumatic.  Eyes: Conjunctivae and EOM are normal. Pupils are equal, round, and reactive to light.  Neck: Normal range of motion.  Cardiovascular: Normal rate, regular rhythm and normal heart sounds.   Pulmonary/Chest: Effort normal and breath sounds normal.  Musculoskeletal: Normal range of motion.  Neurological: He is alert and oriented to person, place, and time.  Skin: Skin is warm and dry.  Psychiatric: He has a normal mood and affect. His behavior is normal. Judgment and thought content normal.    Vitals:   12/01/15 1411 12/01/15 1420  BP: 120/90 120/84  Pulse: 85   Resp: 16   Temp: 98.9 F (37.2 C)    Assessment & Plan:  1. Advice or immunization for travel, [place] Patient presents requesting vaccination for travel.  CDC recommends that all routine childhood vaccinations be updated, yellow fever vaccination, Hepatitis A, malaria and typhoid prophylaxis be administered. Patient received his yellow fever vaccination 2 years ago.  This vaccination is good for at  minimum of 10 years.  Marland Kitchen doxycycline (VIBRAMYCIN) 100 MG capsule    Sig: Take 1 capsule (100 mg total) by mouth daily. Start 2 days prior to travel. Take 1 tablet daily for 4 weeks.  . typhoid (VIVOTIF) DR capsule    Sig: Take 1 capsule by mouth every other day.   Follow-up as needed.  Godfrey Pick. Tiburcio Pea, MSN, FNP-C [facility]

## 2015-12-01 NOTE — Patient Instructions (Addendum)
Start doxycyline 2 days prior to travel, and continue 1 tablet daily for 4 weeks.  Start medication 2 days prior to travel. Start 1 tablet of Vivotif every other day until all medication is complete.  Safe travels,  HanoverKimberly S. Tiburcio PeaHarris, MSN, FNP-C [facility]   IF you received an x-ray today, you will receive an invoice from [facility]. Please contact [facility] at [phone] with questions or concerns regarding your invoice.   IF you received labwork today, you will receive an invoice from [facility]. Please contact [facility] at [phone] with questions or concerns regarding your invoice.   Our billing staff will not be able to assist you with questions regarding bills from these companies.  You will be contacted with the lab results as soon as they are available. The fastest way to get your results is to activate your My Chart account. Instructions are located on the last page of this paperwork. If you have not heard from us regarding the results in 2 weeks, please contact this office.

## 2017-05-29 ENCOUNTER — Ambulatory Visit: Payer: Self-pay | Admitting: Urgent Care

## 2017-05-29 ENCOUNTER — Ambulatory Visit (INDEPENDENT_AMBULATORY_CARE_PROVIDER_SITE_OTHER): Payer: Self-pay

## 2017-05-29 ENCOUNTER — Encounter: Payer: Self-pay | Admitting: Urgent Care

## 2017-05-29 VITALS — BP 120/81 | HR 86 | Temp 98.2°F | Resp 16 | Ht 70.0 in | Wt 180.6 lb

## 2017-05-29 DIAGNOSIS — R5383 Other fatigue: Secondary | ICD-10-CM

## 2017-05-29 DIAGNOSIS — R61 Generalized hyperhidrosis: Secondary | ICD-10-CM

## 2017-05-29 DIAGNOSIS — R05 Cough: Secondary | ICD-10-CM

## 2017-05-29 DIAGNOSIS — J3089 Other allergic rhinitis: Secondary | ICD-10-CM

## 2017-05-29 DIAGNOSIS — R059 Cough, unspecified: Secondary | ICD-10-CM

## 2017-05-29 MED ORDER — MOMETASONE FUROATE 50 MCG/ACT NA SUSP
2.0000 | Freq: Every day | NASAL | 1 refills | Status: DC
Start: 2017-05-29 — End: 2018-07-03

## 2017-05-29 MED ORDER — CETIRIZINE HCL 10 MG PO TABS: 10.0000 mg | ORAL_TABLET | Freq: Every day | ORAL | 11 refills | Status: DC

## 2017-05-29 NOTE — Patient Instructions (Addendum)
Fatigue Fatigue is feeling tired all of the time, a lack of energy, or a lack of motivation. Occasional or mild fatigue is often a normal response to activity or life in general. However, long-lasting (chronic) or extreme fatigue may indicate an underlying medical condition. Follow these instructions at home: Watch your fatigue for any changes. The following actions may help to lessen any discomfort you are feeling:  Talk to your health care provider about how much sleep you need each night. Try to get the required amount every night.  Take medicines only as directed by your health care provider.  Eat a healthy and nutritious diet. Ask your health care provider if you need help changing your diet.  Drink enough fluid to keep your urine clear or pale yellow.  Practice ways of relaxing, such as yoga, meditation, massage therapy, or acupuncture.  Exercise regularly.  Change situations that cause you stress. Try to keep your work and personal routine reasonable.  Do not abuse illegal drugs.  Limit alcohol intake to no more than 1 drink per day for nonpregnant women and 2 drinks per day for men. One drink equals 12 ounces of beer, 5 ounces of wine, or 1 ounces of hard liquor.  Take a multivitamin, if directed by your health care provider.  Contact a health care provider if:  Your fatigue does not get better.  You have a fever.  You have unintentional weight loss or gain.  You have headaches.  You have difficulty: ? Falling asleep. ? Sleeping throughout the night.  You feel angry, guilty, anxious, or sad.  You are unable to have a bowel movement (constipation).  You skin is dry.  Your legs or another part of your body is swollen. Get help right away if:  You feel confused.  Your vision is blurry.  You feel faint or pass out.  You have a severe headache.  You have severe abdominal, pelvic, or back pain.  You have chest pain, shortness of breath, or an irregular or  fast heartbeat.  You are unable to urinate or you urinate less than normal.  You develop abnormal bleeding, such as bleeding from the rectum, vagina, nose, lungs, or nipples.  You vomit blood.  You have thoughts about harming yourself or committing suicide.  You are worried that you might harm someone else. This information is not intended to replace advice given to you by your health care provider. Make sure you discuss any questions you have with your health care provider. Document Released: 12/23/2006 Document Revised: 08/03/2015 Document Reviewed: 06/29/2013 Elsevier Interactive Patient Education  2018 Elsevier Inc.     IF you received an x-ray today, you will receive an invoice from [facility]. Please contact [facility] at [phone] with questions or concerns regarding your invoice.   IF you received labwork today, you will receive an invoice from [facility]. Please contact [facility] at [phone] with questions or concerns regarding your invoice.   Our billing staff will not be able to assist you with questions regarding bills from these companies.  You will be contacted with the lab results as soon as they are available. The fastest way to get your results is to activate your My Chart account. Instructions are located on the last page of this paperwork. If you have not heard from us regarding the results in 2 weeks, please contact this office.      

## 2017-05-29 NOTE — Progress Notes (Signed)
   MRN: [id number] DOB: 11/15/1970  Subjective:   Javier [name] is a 47 y.o. male presenting for 1 month history of night sweats, productive cough, fatigue, intermittent difficulty breathing (especially when he bends down), nasal congestion. Patient recently went to [place] in 2017 between October to November. He has not traveled out of the [place] since then. Denies smoking cigarettes. Denies sinus pain, ear pain, chest pain, n/v, abdominal pain, rashes. Has a history of allergic rhinitis and used to be managed with Zyrtec, Nasonex, Singulair.   Javier [name] is not currently taking any medications and has No Known Allergies.  Javier [name] denies past medical and surgical history.   Objective:   Vitals: BP 120/81   Pulse 86   Temp 98.2 F (36.8 C) (Oral)   Resp 16   Ht 5\' 10"  (1.778 m)   Wt 180 lb 9.6 oz (81.9 kg)   SpO2 100%   BMI 25.91 kg/m   Physical Exam  Constitutional: He is oriented to person, place, and time. He appears well-developed and well-nourished.  HENT:  Mouth/Throat: Oropharynx is clear and moist.  Eyes: Right eye exhibits no discharge. Left eye exhibits no discharge. No scleral icterus.  Neck: Normal range of motion. Neck supple.  Cardiovascular: Normal rate, regular rhythm and intact distal pulses. Exam reveals no gallop and no friction rub.  No murmur heard. Pulmonary/Chest: No respiratory distress. He has no wheezes. He has no rales.  Lymphadenopathy:    He has no cervical adenopathy.  Neurological: He is alert and oriented to person, place, and time.  Skin: Skin is warm and dry. No rash noted.  Psychiatric: He has a normal mood and affect.   Dg Chest 2 View  Result Date: 05/29/2017 CLINICAL DATA:  Cough, night sweats, and fatigue.  Evaluate for TB. EXAM: CHEST - 2 VIEW COMPARISON:  None. FINDINGS: The heart size and mediastinal contours are within normal limits. Both lungs are clear. The visualized skeletal structures are unremarkable. IMPRESSION: No active cardiopulmonary  disease. Electronically Signed   By: Obie DredgeWilliam T Derry M.D.   On: 05/29/2017 17:40    Assessment and Plan :   Cough - Plan: DG Chest 2 View, QuantiFERON-TB Gold Plus  Night sweats - Plan: DG Chest 2 View, QuantiFERON-TB Gold Plus  Other fatigue - Plan: DG Chest 2 View, QuantiFERON-TB Gold Plus, TSH, CBC, HIV antibody, Basic metabolic panel  Non-seasonal allergic rhinitis due to other allergic trigger - Plan: mometasone (NASONEX) 50 MCG/ACT nasal spray  Labs pending. Will start allergy treatment for now given reassuring chest x-ray. Counseled that we are still trying to rule out latent tuberculosis with Quantiferon gold. Follow up with results.  Wallis BambergMario Cleon Thoma, PA-C [facility] [phone] 05/29/2017  5:26 PM

## 2017-05-29 NOTE — Progress Notes (Signed)
  Tuberculosis Risk Questionnaire  1. Yes [place] Were you born outside the [place] in one of the following parts of the world: [place], [place], [place], [place] or [place]?    2. Yes [place] Have you traveled outside the [place] and lived for more than one month in one of the following parts of the world: [place], [place], [place], [place] or [place]?    3. No Do you have a compromised immune system such as from any of the following conditions:HIV/AIDS, organ or bone marrow transplantation, diabetes, immunosuppressive medicines (e.g. Prednisone, Remicaide), leukemia, lymphoma, cancer of the head or neck, gastrectomy or jejunal bypass, end-stage renal disease (on dialysis), or silicosis?     4. No Have you ever or do you plan on working in: a residential care center, a health care facility, a jail or prison or homeless shelter?    5. No Have you ever: injected illegal drugs, used crack cocaine, lived in a homeless shelter  or been in jail or prison?     6. No Have you ever been exposed to anyone with infectious tuberculosis?  7. Yes  Have you ever had a BCG vaccine? (BCG is a vaccine for tuberculosis  (TB) used in OTHER countries, NOT in the [place]).  8. No Have you ever been advised by a health care provider NOT to have a TB skin test?  9. No Have you ever had a POSITIVE TB skin test?  IF SO, when? n/a  IF SO, were you treated with INH? n/a  IF SO, where? n/a  Tuberculosis Symptom Questionnaire  Do you currently have any of the following symptoms?  1. Yes  Unexplained cough lasting more than 3 weeks?   2. No Unexplained fever lasting more than 3 weeks.   3. Yes  Night Sweats (sweating that leaves the bedclothes and sheets wet)     4. No Shortness of Breath   5. No Chest Pain   6. No Unintentional weight loss    7. Yes  Unexplained fatigue (very tired for no reason)

## 2017-05-30 LAB — CBC
HEMATOCRIT: 43.9 % (ref 37.5–51.0)
Hemoglobin: 15.5 g/dL (ref 13.0–17.7)
MCH: 32.6 pg (ref 26.6–33.0)
MCHC: 35.3 g/dL (ref 31.5–35.7)
MCV: 92 fL (ref 79–97)
Platelets: 288 10*3/uL (ref 150–379)
RBC: 4.75 x10E6/uL (ref 4.14–5.80)
RDW: 13.3 % (ref 12.3–15.4)
WBC: 5.5 10*3/uL (ref 3.4–10.8)

## 2017-05-30 LAB — BASIC METABOLIC PANEL
BUN / CREAT RATIO: 10 (ref 9–20)
BUN: 9 mg/dL (ref 6–24)
CO2: 24 mmol/L (ref 20–29)
CREATININE: 0.94 mg/dL (ref 0.76–1.27)
Calcium: 9.6 mg/dL (ref 8.7–10.2)
Chloride: 101 mmol/L (ref 96–106)
GFR, EST AFRICAN AMERICAN: 110 mL/min/{1.73_m2} (ref >59)
GFR, EST NON AFRICAN AMERICAN: 95 mL/min/{1.73_m2} (ref >59)
Glucose: 84 mg/dL (ref 65–99)
Potassium: 4.3 mmol/L (ref 3.5–5.2)
Sodium: 142 mmol/L (ref 134–144)

## 2017-05-30 LAB — HIV ANTIBODY (ROUTINE TESTING W REFLEX): HIV Screen 4th Generation wRfx: NONREACTIVE

## 2017-05-30 LAB — TSH: TSH: 2.17 u[IU]/mL (ref 0.450–4.500)

## 2017-06-02 LAB — QUANTIFERON-TB GOLD PLUS
QUANTIFERON TB2 AG VALUE: 0.22 [IU]/mL
QuantiFERON Mitogen Value: >10 IU/mL
QuantiFERON Nil Value: 0.03 IU/mL
QuantiFERON TB1 Ag Value: 0.16 IU/mL
QuantiFERON-TB Gold Plus: NEGATIVE

## 2017-06-03 ENCOUNTER — Telehealth: Payer: Self-pay | Admitting: Urgent Care

## 2017-06-03 ENCOUNTER — Encounter: Payer: Self-pay | Admitting: Urgent Care

## 2017-06-03 NOTE — Telephone Encounter (Signed)
Copied from CRM [id number]. Topic: General - Other >> Jun 03, 2017  1:18 PM Gerrianne ScalePayne, Danielly Ackerley L wrote: Reason for CRM: patient calling about lab results

## 2017-06-03 NOTE — Telephone Encounter (Signed)
Please report all labs were negative including tuberculosis screening. He has normal kidney function, blood cell counts, thyroid. Also tested negative for HIV. If symptoms are persisting please have patient rtc as he may need steroid course but must be rechecked in clinic.

## 2017-06-03 NOTE — Telephone Encounter (Signed)
Provider, please review and release results.  

## 2017-06-04 NOTE — Telephone Encounter (Signed)
Reported to pt via mychart.

## 2018-07-01 ENCOUNTER — Emergency Department (HOSPITAL_COMMUNITY): Payer: HRSA Program

## 2018-07-01 ENCOUNTER — Emergency Department (HOSPITAL_COMMUNITY)
Admission: EM | Admit: 2018-07-01 | Discharge: 2018-07-01 | Disposition: A | Discharge status: Home / Self Care | Payer: HRSA Program | Attending: Emergency Medicine | Admitting: Emergency Medicine

## 2018-07-01 ENCOUNTER — Encounter (HOSPITAL_COMMUNITY): Payer: Self-pay

## 2018-07-01 ENCOUNTER — Other Ambulatory Visit: Payer: Self-pay

## 2018-07-01 ENCOUNTER — Telehealth (HOSPITAL_BASED_OUTPATIENT_CLINIC_OR_DEPARTMENT_OTHER): Payer: Self-pay | Admitting: Emergency Medicine

## 2018-07-01 DIAGNOSIS — R0602 Shortness of breath: Secondary | ICD-10-CM | POA: Diagnosis present

## 2018-07-01 DIAGNOSIS — R059 Cough, unspecified: Secondary | ICD-10-CM

## 2018-07-01 DIAGNOSIS — Z79899 Other long term (current) drug therapy: Secondary | ICD-10-CM | POA: Insufficient documentation

## 2018-07-01 DIAGNOSIS — R509 Fever, unspecified: Secondary | ICD-10-CM

## 2018-07-01 DIAGNOSIS — R05 Cough: Secondary | ICD-10-CM

## 2018-07-01 LAB — CBC WITH DIFFERENTIAL/PLATELET
Abs Immature Granulocytes: 0.02 10*3/uL (ref 0.00–0.07)
Basophils Absolute: 0 10*3/uL (ref 0.0–0.1)
Basophils Relative: 0 %
Eosinophils Absolute: 0 10*3/uL (ref 0.0–0.5)
Eosinophils Relative: 0 %
HCT: 46 % (ref 39.0–52.0)
Hemoglobin: 16.3 g/dL (ref 13.0–17.0)
Immature Granulocytes: 0 %
Lymphocytes Relative: 27 %
Lymphs Abs: 1.3 10*3/uL (ref 0.7–4.0)
MCH: 32.8 pg (ref 26.0–34.0)
MCHC: 35.4 g/dL (ref 30.0–36.0)
MCV: 92.6 fL (ref 80.0–100.0)
Monocytes Absolute: 0.8 10*3/uL (ref 0.1–1.0)
Monocytes Relative: 16 %
Neutro Abs: 2.7 10*3/uL (ref 1.7–7.7)
Neutrophils Relative %: 57 %
Platelets: 209 10*3/uL (ref 150–400)
RBC: 4.97 MIL/uL (ref 4.22–5.81)
RDW: 11.6 % (ref 11.5–15.5)
WBC: 4.8 10*3/uL (ref 4.0–10.5)
nRBC: 0 % (ref 0.0–0.2)

## 2018-07-01 LAB — LACTIC ACID, PLASMA: Lactic Acid, Venous: 1.3 mmol/L (ref 0.5–1.9)

## 2018-07-01 LAB — URINALYSIS, ROUTINE W REFLEX MICROSCOPIC
Bacteria, UA: NONE SEEN
Bilirubin Urine: NEGATIVE
Glucose, UA: NEGATIVE mg/dL
Ketones, ur: 5 mg/dL — AB
Leukocytes,Ua: NEGATIVE
Nitrite: NEGATIVE
Protein, ur: NEGATIVE mg/dL
Specific Gravity, Urine: 1.008 (ref 1.005–1.030)
pH: 6 (ref 5.0–8.0)

## 2018-07-01 LAB — COMPREHENSIVE METABOLIC PANEL
ALT: 18 U/L (ref 0–44)
AST: 29 U/L (ref 15–41)
Albumin: 3.4 g/dL — ABNORMAL LOW (ref 3.5–5.0)
Alkaline Phosphatase: 32 U/L — ABNORMAL LOW (ref 38–126)
Anion gap: 18 — ABNORMAL HIGH (ref 5–15)
BUN: 10 mg/dL (ref 6–20)
CO2: 19 mmol/L — ABNORMAL LOW (ref 22–32)
Calcium: 8.5 mg/dL — ABNORMAL LOW (ref 8.9–10.3)
Chloride: 95 mmol/L — ABNORMAL LOW (ref 98–111)
Creatinine, Ser: 1.1 mg/dL (ref 0.61–1.24)
GFR calc Af Amer: >60 mL/min (ref >60)
GFR calc non Af Amer: >60 mL/min (ref >60)
Glucose, Bld: 164 mg/dL — ABNORMAL HIGH (ref 70–99)
Potassium: 3.8 mmol/L (ref 3.5–5.1)
Sodium: 132 mmol/L — ABNORMAL LOW (ref 135–145)
Total Bilirubin: 0.9 mg/dL (ref 0.3–1.2)
Total Protein: 8.2 g/dL — ABNORMAL HIGH (ref 6.5–8.1)

## 2018-07-01 LAB — SARS CORONAVIRUS 2 BY RT PCR (HOSPITAL ORDER, PERFORMED IN ~~LOC~~ HOSPITAL LAB): SARS Coronavirus 2: POSITIVE — AB

## 2018-07-01 LAB — TROPONIN I: Troponin I: <0.03 ng/mL (ref <0.03)

## 2018-07-01 LAB — PROCALCITONIN: Procalcitonin: <0.1 ng/mL

## 2018-07-01 MED ORDER — SODIUM CHLORIDE 0.9 % IV BOLUS
1000.0000 mL | Freq: Once | INTRAVENOUS | Status: AC
  Administered 2018-07-01: 1000 mL via INTRAVENOUS

## 2018-07-01 MED ORDER — ALBUTEROL SULFATE HFA 108 (90 BASE) MCG/ACT IN AERS: 1.0000 | INHALATION_SPRAY | Freq: Four times a day (QID) | RESPIRATORY_TRACT | 0 refills | Status: DC | PRN

## 2018-07-01 MED ORDER — AZITHROMYCIN 250 MG PO TABS: 250.0000 mg | ORAL_TABLET | Freq: Every day | ORAL | 0 refills | Status: DC

## 2018-07-01 NOTE — ED Triage Notes (Signed)
Pt arrived via [facility]; pt with c/o SOB and fever that started at approx midnight. Pt in [employer] and recently came hm from [place] on 4/2; lungs cta; 96% on RA; 118/72; no CBG; 110; 102.2 T and EMS administered 1G of tylenol

## 2018-07-01 NOTE — ED Notes (Signed)
Patient wife picking up patient.

## 2018-07-01 NOTE — ED Provider Notes (Signed)
[facility] Note   CSN: [id number] Arrival date & time: 07/01/18  0442    History   Chief Complaint Chief Complaint  Patient presents with   Fever   Shortness of Breath    HPI Javier [name] is a 48 y.o. male.     The history is provided by the patient and medical records.  Fever  Associated symptoms: cough   Shortness of Breath  Associated symptoms: cough and fever      48 y.o. M presenting to the ED for fever, cough, shortness of breath.  He reports this began yesterday but has been progressively worsening.  States his breathing feels very labored.  Cough is been productive with white mucus.  He has not noted any hemoptysis.  Had fever of 102F with EMS, given 1g tylenol just 10-15 mins PTA.  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  He denies any known sick contacts or known COVID exposures.  He has not had any nausea or vomiting.  He denies any chest pain.  No abdominal pain.  He has not been taking any medications at home for his symptoms.   Of note, patient from [place].  Last travel out of the country was apparently in 2017 (back to [place]).  He was worked up last year with concern of TB due to persistent cough, fever, and night sweats.  He had negative TB and HIV testing at that time.  History reviewed. No pertinent past medical history.  Patient Active Problem List   Diagnosis Date Noted   Rhinitis, allergic 05/22/2015   Elevated BP 05/22/2015    History reviewed. No pertinent surgical history.      Home Medications    Prior to Admission medications   Medication Sig Start Date End Date Taking? Authorizing Provider  cetirizine (ZYRTEC) 10 MG tablet Take 1 tablet (10 mg total) by mouth daily. 05/29/17   Wallis BambergMani, Mario, PA-C  mometasone (NASONEX) 50 MCG/ACT nasal spray Place 2 sprays into the nose daily. 05/29/17   Wallis BambergMani, Mario, PA-C    Family History History reviewed. No pertinent  family history.  Social History Social History   Tobacco Use   Smoking status: Never Smoker   Smokeless tobacco: Never Used  Substance Use Topics   Alcohol use: Yes    Comment: socially   Drug use: Not on file     Allergies   Patient has no known allergies.   Review of Systems Review of Systems  Constitutional: Positive for fever.  Respiratory: Positive for cough and shortness of breath.   All other systems reviewed and are negative.    Physical Exam Updated Vital Signs BP 113/80    Pulse 91    Temp (!) 102.8 F (39.3 C) (Oral)    Resp 13    Ht 5\' 6"  (1.676 m)    Wt 78.9 kg    SpO2 94%    BMI 28.08 kg/m   Physical Exam Vitals signs and nursing note reviewed.  Constitutional:      Appearance: He is well-developed.  HENT:     Head: Normocephalic and atraumatic.     Mouth/Throat:     Lips: Pink.     Mouth: Mucous membranes are moist.     Pharynx: Oropharynx is clear.  Eyes:     Conjunctiva/sclera: Conjunctivae normal.     Pupils: Pupils are equal, round, and reactive to light.  Neck:     Musculoskeletal: Normal range of motion.  Cardiovascular:     Rate and Rhythm: Regular rhythm. Tachycardia present.     Heart sounds: Normal heart sounds.     Comments: HR around 112 during exam Pulmonary:     Effort: Pulmonary effort is normal. Tachypnea present.     Breath sounds: Normal breath sounds. No wheezing or rhonchi.     Comments: Mildly tachypneic but no significant increased work of breathing, he is able to speak in sentences without difficulty, lungs are overall clear to auscultation Abdominal:     General: Bowel sounds are normal.     Palpations: Abdomen is soft.  Musculoskeletal: Normal range of motion.  Skin:    General: Skin is warm and dry.  Neurological:     Mental Status: He is alert and oriented to person, place, and time.      ED Treatments / Results  Labs (all labs ordered are listed, but only abnormal results are displayed) Labs Reviewed   COMPREHENSIVE METABOLIC PANEL - Abnormal; Notable for the following components:      Result Value   Sodium 132 (*)    Chloride 95 (*)    CO2 19 (*)    Glucose, Bld 164 (*)    Calcium 8.5 (*)    Total Protein 8.2 (*)    Albumin 3.4 (*)    Alkaline Phosphatase 32 (*)    Anion gap 18 (*)    All other components within normal limits  URINALYSIS, ROUTINE W REFLEX MICROSCOPIC - Abnormal; Notable for the following components:   Hgb urine dipstick SMALL (*)    Ketones, ur 5 (*)    All other components within normal limits  SARS CORONAVIRUS 2 (HOSPITAL ORDER, PERFORMED IN [facility])  CULTURE, BLOOD (ROUTINE X 2)  CULTURE, BLOOD (ROUTINE X 2)  URINE CULTURE  CBC WITH DIFFERENTIAL/PLATELET  TROPONIN I  LACTIC ACID, PLASMA  PROCALCITONIN    EKG EKG Interpretation  Date/Time:  Wednesday July 01 2018 05:02:10 EDT Ventricular Rate:  105 PR Interval:    QRS Duration: 90 QT Interval:  339 QTC Calculation: 448 R Axis:   -3 Text Interpretation:  Sinus tachycardia Ventricular premature complex Otherwise within normal limits When compared with ECG of EARLIER SAME DATE Premature ventricular complexes are now present Confirmed by Dione Booze ([id number]) on 07/01/2018 5:18:38 AM   Radiology Portable Chest 1 View  Result Date: 07/01/2018 CLINICAL DATA:  47 year old male with shortness of breath and fever. EXAM: PORTABLE CHEST 1 VIEW COMPARISON:  05/29/2017. FINDINGS: Portable AP semi upright view at 0504 hours. Lower lung volumes. Mediastinal contours remain normal. Visualized tracheal air column is within normal limits. Peripheral patchy and indistinct increased pulmonary opacity when compared to 2019, greater on the left. Relatively isolated involvement in the right mid lung. No superimposed pneumothorax, pulmonary edema or pleural effusion. Gas-filled but nondilated bowel loops in the upper abdomen. No osseous abnormality identified. IMPRESSION: Lower lung volumes with peripheral  patchy and indistinct bilateral pulmonary opacities greater on the left. Consider viral/atypical respiratory infection. No pleural effusion. Electronically Signed   By: Odessa Fleming M.D.   On: 07/01/2018 05:26    Procedures Procedures (including critical care time)  Medications Ordered in ED Medications  sodium chloride 0.9 % bolus 1,000 mL (1,000 mLs Intravenous New Bag/Given 07/01/18 0510)     Initial Impression / Assessment and Plan / ED Course  I have reviewed the triage vital signs and the nursing notes.  Pertinent labs & imaging results that were available during my care  of the patient were reviewed by me and considered in my medical decision making (see chart for details).  Ayuub Scoma was evaluated in Emergency Department on 07/01/2018 for the symptoms described in the history of present illness. He was evaluated in the context of the global COVID-19 pandemic, which necessitated consideration that the patient might be at risk for infection with the SARS-CoV-2 virus that causes COVID-19. Institutional protocols and algorithms that pertain to the evaluation of patients at risk for COVID-19 are in a state of rapid change based on information released by regulatory bodies including the CDC and federal and state organizations. These policies and algorithms were followed during the patient's care in the ED.  48 y.o. M here with fever, cough, SOB.  Symptoms reportedly began yesterday and worsening throughout the night.  States his breathing feels labored.  Cough is been productive with white sputum.  He denies any hemoptysis.  Denies any chest pain.  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  He denies any known sick exposures or known COVID exposures.  He is febrile here but overall nontoxic in appearance.  He is tachycardic and tachypneic but respirations are unlabored.  His lungs are overall clear.  We will proceed with sepsis work-up.  On chart review, patient was worked  up last year for ongoing fever, cough, night sweats for several months.  He had evaluation for TB and HIV at that time that were negative.  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  Given IVF.  Tylenol administered just PTA.  Patient's CXR with bilateral opacities concerning for viral/atypical infection.  Labs overall reassuring-- lactate and WBC count normal.  Mildly increased anion gap of 18 but no overlying concerning electrolyte derangements.  Troponin is negative.  UA is negative.  Procalcitonin, blood culutures, and rapid COVID test is still pending.  6:51 AM Called and spoke with patient via telephone.  He states he is feeling better, no longer feels feverish, breathing feels better.  His vitals have remainder stable on monitoring, HR now WNL and O2 sats have remained around 96% on my multiple re-checks to his room.  I have discussed that COVID test is still pending at this time, but even if positive he will likely be stable enough for OP management as no hypoxia or respiratory distress here.  He will need to self quarantine per CDC guidelines including at least 7 days after fever onset AND fever free for at least 3 days without antipyretics.  He acknowledged understanding.    Care signed out to PA Joy at shift change.  Plan will be to follow-up on remaining labs and re-assess.  If patient remaining without respiratory distress, hypoxia, etc then likely can discharge.  Rx azithromycin and albuterol inhaler written and discharge paperwork prepared.  Final Clinical Impressions(s) / ED Diagnoses   Final diagnoses:  Shortness of breath  Cough  Fever in adult    ED Discharge Orders         Ordered    azithromycin (ZITHROMAX) 250 MG tablet  Daily     07/01/18 0654           Garlon Hatchet, PA-C 07/01/18 8333    Shon Baton, MD 07/06/18 [phone]

## 2018-07-01 NOTE — ED Provider Notes (Signed)
Javier ListHarouna [name] is a 48 y.o. male, presenting to the ED with cough, fever, and shortness of breath.  HPI from Sharilyn SitesLisa Sanders, PA-C: "48 y.o. M presenting to the ED for fever, cough, shortness of breath.  He reports this began yesterday but has been progressively worsening.  States his breathing feels very labored.  Cough is been productive with white mucus.  He has not noted any hemoptysis.  Had fever of 102F with EMS, given 1g tylenol just 10-15 mins PTA.  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  He denies any known sick contacts or known COVID exposures.  He has not had any nausea or vomiting.  He denies any chest pain.  No abdominal pain.  He has not been taking any medications at home for his symptoms.   Of note, patient from [place].  Last travel out of the country was apparently in 2017 (back to [place]).  He was worked up last year with concern of TB due to persistent cough, fever, and night sweats.  He had negative TB and HIV testing at that time."   Abnormal Labs Reviewed  SARS CORONAVIRUS 2 (HOSPITAL ORDER, PERFORMED IN [facility]) - Abnormal; Notable for the following components:      Result Value   SARS Coronavirus 2 POSITIVE (*)    All other components within normal limits  COMPREHENSIVE METABOLIC PANEL - Abnormal; Notable for the following components:   Sodium 132 (*)    Chloride 95 (*)    CO2 19 (*)    Glucose, Bld 164 (*)    Calcium 8.5 (*)    Total Protein 8.2 (*)    Albumin 3.4 (*)    Alkaline Phosphatase 32 (*)    Anion gap 18 (*)    All other components within normal limits  URINALYSIS, ROUTINE W REFLEX MICROSCOPIC - Abnormal; Notable for the following components:   Hgb urine dipstick SMALL (*)    Ketones, ur 5 (*)    All other components within normal limits    Portable Chest 1 View  Result Date: 07/01/2018 CLINICAL DATA:  48 year old male with shortness of breath and fever. EXAM: PORTABLE CHEST 1 VIEW COMPARISON:   05/29/2017. FINDINGS: Portable AP semi upright view at 0504 hours. Lower lung volumes. Mediastinal contours remain normal. Visualized tracheal air column is within normal limits. Peripheral patchy and indistinct increased pulmonary opacity when compared to 2019, greater on the left. Relatively isolated involvement in the right mid lung. No superimposed pneumothorax, pulmonary edema or pleural effusion. Gas-filled but nondilated bowel loops in the upper abdomen. No osseous abnormality identified. IMPRESSION: Lower lung volumes with peripheral patchy and indistinct bilateral pulmonary opacities greater on the left. Consider viral/atypical respiratory infection. No pleural effusion. Electronically Signed   By: Odessa FlemingH  Hall M.D.   On: 07/01/2018 05:26    Clinical Course as of Jul 01 819  Wed Jul 01, 2018  0725 I spoke with the patient via telephone.  He states he is breathing "just fine" and denies any additional symptoms.  He was able to speak in full sentences without noted difficulty or abnormal conversational pauses. I informed him of his positive coronavirus test and went over quarantine instructions.  An opportunity for questions was presented, but patient had none.   [SJ]    Clinical Course User Index [SJ] Anselm PancoastJoy, Finlee Milo C, PA-C   Patient care handoff report received from Sharilyn SitesLisa Sanders, PA-C. Plan: Await results of coronavirus testing, inform patient of results, assure adequate  vital signs, and discharge.  Patient was monitored remotely by me prior to discharge.  He maintained excellent SPO2 on room air.  Doorway assessment performed to limit number of personnel in contact with this COVID-19 positive patient.  No increased work of breathing noted on doorway assessment.  Vitals:   07/01/18 0454 07/01/18 0500 07/01/18 0515 07/01/18 0530  BP:  119/82 122/81 113/80  Pulse:  (!) 104 (!) 104 91  Resp:  (!) 28 (!) 22 13  Temp:      TempSrc:      SpO2:  98% 97% 94%  Weight: 78.9 kg     Height: 5\' 6"   (1.676 m)      Vitals:   07/01/18 0530 07/01/18 0715 07/01/18 0730 07/01/18 0735  BP: 113/80  108/77   Pulse: 91  88   Resp: 13 18 19    Temp:    99 F (37.2 C)  TempSrc:    Oral  SpO2: 94% 99% 100%   Weight:      Height:             Concepcion Living 07/01/18 0821    Jacalyn Lefevre, MD 07/01/18 [phone]

## 2018-07-01 NOTE — Discharge Instructions (Signed)
Take the prescribed medication as directed.  You will need to continue to self quarantine at home for at least 7 days since symptom onset AND fever free for at least 3 days without tylenol or motrin. Follow-up with your primary care doctor. Please return here for any new/acute changes-- worsening difficulty breathing, high fever not responding to medications, chest pain, etc.     Person Under Monitoring Name: Javier [name]  Location: [address] [place] [address]   Infection Prevention Recommendations for Individuals Confirmed to have, or Being Evaluated for, 2019 Novel Coronavirus (COVID-19) Infection Who Receive Care at Home  Individuals who are confirmed to have, or are being evaluated for, COVID-19 should follow the prevention steps below until a healthcare provider or local or state health department says they can return to normal activities.  Stay home except to get medical care You should restrict activities outside your home, except for getting medical care. Do not go to work, school, or public areas, and do not use public transportation or taxis.  Call ahead before visiting your doctor Before your medical appointment, call the healthcare provider and tell them that you have, or are being evaluated for, COVID-19 infection. This will help the healthcare providers office take steps to keep other people from getting infected. Ask your healthcare provider to call the local or state health department.  Monitor your symptoms Seek prompt medical attention if your illness is worsening (e.g., difficulty breathing). Before going to your medical appointment, call the healthcare provider and tell them that you have, or are being evaluated for, COVID-19 infection. Ask your healthcare provider to call the local or state health department.  Wear a facemask You should wear a facemask that covers your nose and mouth when you are in the same room with other people  and when you visit a healthcare provider. People who live with or visit you should also wear a facemask while they are in the same room with you.  Separate yourself from other people in your home As much as possible, you should stay in a different room from other people in your home. Also, you should use a separate bathroom, if available.  Avoid sharing household items You should not share dishes, drinking glasses, cups, eating utensils, towels, bedding, or other items with other people in your home. After using these items, you should wash them thoroughly with soap and water.  Cover your coughs and sneezes Cover your mouth and nose with a tissue when you cough or sneeze, or you can cough or sneeze into your sleeve. Throw used tissues in a lined trash can, and immediately wash your hands with soap and water for at least 20 seconds or use an alcohol-based hand rub.  Wash your [employer] your hands often and thoroughly with soap and water for at least 20 seconds. You can use an alcohol-based hand sanitizer if soap and water are not available and if your hands are not visibly dirty. Avoid touching your eyes, nose, and mouth with unwashed hands.   Prevention Steps for Caregivers and Household Members of Individuals Confirmed to have, or Being Evaluated for, COVID-19 Infection Being Cared for in the Home  If you live with, or provide care at home for, a person confirmed to have, or being evaluated for, COVID-19 infection please follow these guidelines to prevent infection:  Follow healthcare providers instructions Make sure that you understand and can help the patient follow any healthcare provider instructions for all care.  Provide  for the patients basic needs You should help the patient with basic needs in the home and provide support for getting groceries, prescriptions, and other personal needs.  Monitor the patients symptoms If they are getting sicker, call his or her medical  provider and tell them that the patient has, or is being evaluated for, COVID-19 infection. This will help the healthcare providers office take steps to keep other people from getting infected. Ask the healthcare provider to call the local or state health department.  Limit the number of people who have contact with the patient If possible, have only one caregiver for the patient. Other household members should stay in another home or place of residence. If this is not possible, they should stay in another room, or be separated from the patient as much as possible. Use a separate bathroom, if available. Restrict visitors who do not have an essential need to be in the home.  Keep older adults, very young children, and other sick people away from the patient Keep older adults, very young children, and those who have compromised immune systems or chronic health conditions away from the patient. This includes people with chronic heart, lung, or kidney conditions, diabetes, and cancer.  Ensure good ventilation Make sure that shared spaces in the home have good air flow, such as from an air conditioner or an opened window, weather permitting.  Wash your hands often Wash your hands often and thoroughly with soap and water for at least 20 seconds. You can use an alcohol based hand sanitizer if soap and water are not available and if your hands are not visibly dirty. Avoid touching your eyes, nose, and mouth with unwashed hands. Use disposable paper towels to dry your hands. If not available, use dedicated cloth towels and replace them when they become wet.  Wear a facemask and gloves Wear a disposable facemask at all times in the room and gloves when you touch or have contact with the patients blood, body fluids, and/or secretions or excretions, such as sweat, saliva, sputum, nasal mucus, vomit, urine, or feces.  Ensure the mask fits over your nose and mouth tightly, and do not touch it during  use. Throw out disposable facemasks and gloves after using them. Do not reuse. Wash your hands immediately after removing your facemask and gloves. If your personal clothing becomes contaminated, carefully remove clothing and launder. Wash your hands after handling contaminated clothing. Place all used disposable facemasks, gloves, and other waste in a lined container before disposing them with other household waste. Remove gloves and wash your hands immediately after handling these items.  Do not share dishes, glasses, or other household items with the patient Avoid sharing household items. You should not share dishes, drinking glasses, cups, eating utensils, towels, bedding, or other items with a patient who is confirmed to have, or being evaluated for, COVID-19 infection. After the person uses these items, you should wash them thoroughly with soap and water.  Wash laundry thoroughly Immediately remove and wash clothes or bedding that have blood, body fluids, and/or secretions or excretions, such as sweat, saliva, sputum, nasal mucus, vomit, urine, or feces, on them. Wear gloves when handling laundry from the patient. Read and follow directions on labels of laundry or clothing items and detergent. In general, wash and dry with the warmest temperatures recommended on the label.  Clean all areas the individual has used often Clean all touchable surfaces, such as counters, tabletops, doorknobs, bathroom fixtures, toilets, phones, keyboards, tablets, and  bedside tables, every day. Also, clean any surfaces that may have blood, body fluids, and/or secretions or excretions on them. Wear gloves when cleaning surfaces the patient has come in contact with. Use a diluted bleach solution (e.g., dilute bleach with 1 part bleach and 10 parts water) or a household disinfectant with a label that says EPA-registered for coronaviruses. To make a bleach solution at home, add 1 tablespoon of bleach to 1 quart (4  cups) of water. For a larger supply, add  cup of bleach to 1 gallon (16 cups) of water. Read labels of cleaning products and follow recommendations provided on product labels. Labels contain instructions for safe and effective use of the cleaning product including precautions you should take when applying the product, such as wearing gloves or eye protection and making sure you have good ventilation during use of the product. Remove gloves and wash hands immediately after cleaning.  Monitor yourself for signs and symptoms of illness Caregivers and household members are considered close contacts, should monitor their health, and will be asked to limit movement outside of the home to the extent possible. Follow the monitoring steps for close contacts listed on the symptom monitoring form.   ? If you have additional questions, contact your local health department or call the epidemiologist on call at [phone] (available 24/7). ? This guidance is subject to change. For the most up-to-date guidance from [facility], please refer to their website: [email or web address]

## 2018-07-01 NOTE — ED Notes (Signed)
Wife arrived  

## 2018-07-01 NOTE — ED Notes (Signed)
Patient calling for a ride home stated came by EMS and doesn't have a car here.

## 2018-07-02 ENCOUNTER — Emergency Department (HOSPITAL_COMMUNITY)
Admission: EM | Admit: 2018-07-02 | Discharge: 2018-07-02 | Disposition: A | Discharge status: Home / Self Care | Payer: Medicaid Other | Attending: Emergency Medicine | Admitting: Emergency Medicine

## 2018-07-02 ENCOUNTER — Other Ambulatory Visit: Payer: Self-pay

## 2018-07-02 ENCOUNTER — Emergency Department (HOSPITAL_COMMUNITY): Payer: Medicaid Other

## 2018-07-02 DIAGNOSIS — R0602 Shortness of breath: Secondary | ICD-10-CM | POA: Diagnosis present

## 2018-07-02 DIAGNOSIS — I10 Essential (primary) hypertension: Secondary | ICD-10-CM | POA: Diagnosis not present

## 2018-07-02 LAB — URINE CULTURE: Culture: NO GROWTH

## 2018-07-02 MED ORDER — DOXYCYCLINE HYCLATE 100 MG PO CAPS: 100.0000 mg | ORAL_CAPSULE | Freq: Two times a day (BID) | ORAL | 0 refills | Status: DC

## 2018-07-02 MED ORDER — ALBUTEROL SULFATE HFA 108 (90 BASE) MCG/ACT IN AERS
8.0000 | INHALATION_SPRAY | Freq: Once | RESPIRATORY_TRACT | Status: AC
  Administered 2018-07-02: 8 via RESPIRATORY_TRACT
  Filled 2018-07-02: qty 6.7

## 2018-07-02 MED ORDER — ACETAMINOPHEN 325 MG PO TABS
325.0000 mg | ORAL_TABLET | Freq: Once | ORAL | Status: AC
  Administered 2018-07-02: 02:00:00 325 mg via ORAL
  Filled 2018-07-02: qty 1

## 2018-07-02 MED ORDER — ACETAMINOPHEN 325 MG PO TABS
650.0000 mg | ORAL_TABLET | Freq: Once | ORAL | Status: AC | PRN
  Administered 2018-07-02: 01:00:00 650 mg via ORAL
  Filled 2018-07-02: qty 2

## 2018-07-02 NOTE — ED Triage Notes (Addendum)
Pt seen here in ED yesterday for SOB, treated and discharged. However was tested for COVID and results were positive. Pt aware but tonight his SOB began to progressively increase. Took medication prescribed antibiotic and inhaler with some relief. Pt and EMS stated no fever reducing medication given.

## 2018-07-02 NOTE — ED Provider Notes (Signed)
[facility] EMERGENCY DEPARTMENT Provider Note   CSN: [id number] Arrival date & time: 07/02/18  0037    History   Chief Complaint Chief Complaint  Patient presents with   Shortness of Breath    HPI Javier [name] is a 48 y.o. male with a history of hypertension and allergic rhinitis who presents to the emergency department with a chief complaint of shortness of breath.  The patient was seen in the ER 24 hours prior for fever, cough, shortness of breath and it tested positive for COVID-19.  He was discharged home with azithromycin and albuterol MDI.  He reports that he has not taken any Tylenol for his fever today.  He reports that he is 2 puffs of his albuterol inhaler at approximately 2130 with mild improvement.  He has been compliant with home azithromycin.  He reports that his shortness of breath worsens around 2330.  He did not use his albuterol inhaler after shortness of breath worsened.  EMS reports the patient was not hypoxic in route, but was placed on supplemental oxygen for comfort.  He was not given any antipyretics in route.  He denies syncope, leg swelling, chest pain, vomiting, abdominal pain, palpitations, back pain.   Javier [name] was evaluated in Emergency Department on 07/02/2018 for the symptoms described in the history of present illness. He was evaluated in the context of the global COVID-19 pandemic, which necessitated consideration that the patient might be at risk for infection with the SARS-CoV-2 virus that causes COVID-19. Institutional protocols and algorithms that pertain to the evaluation of patients at risk for COVID-19 are in a state of rapid change based on information released by regulatory bodies including the CDC and federal and state organizations. These policies and algorithms were followed during the patient's care in the ED.      The history is provided by the patient. No language interpreter was used.    No past medical  history on file.  Patient Active Problem List   Diagnosis Date Noted   Rhinitis, allergic 05/22/2015   Elevated BP 05/22/2015    No past surgical history on file.      Home Medications    Prior to Admission medications   Medication Sig Start Date End Date Taking? Authorizing Provider  albuterol (VENTOLIN HFA) 108 (90 Base) MCG/ACT inhaler Inhale 1-2 puffs into the lungs every 6 (six) hours as needed for wheezing. 07/01/18   Garlon Hatchet, PA-C  azithromycin (ZITHROMAX) 250 MG tablet Take 1 tablet (250 mg total) by mouth daily. Take first 2 tablets together, then 1 every day until finished. 07/01/18   Garlon Hatchet, PA-C  cetirizine (ZYRTEC) 10 MG tablet Take 1 tablet (10 mg total) by mouth daily. Patient not taking: Reported on 07/01/2018 05/29/17   Wallis Bamberg, PA-C  doxycycline (VIBRAMYCIN) 100 MG capsule Take 1 capsule (100 mg total) by mouth 2 (two) times daily. 07/02/18   Arnola Crittendon A, PA-C  mometasone (NASONEX) 50 MCG/ACT nasal spray Place 2 sprays into the nose daily. Patient not taking: Reported on 07/01/2018 05/29/17   Wallis Bamberg, PA-C    Family History No family history on file.  Social History Social History   Tobacco Use   Smoking status: Never Smoker   Smokeless tobacco: Never Used  Substance Use Topics   Alcohol use: Yes    Comment: socially   Drug use: Not on file     Allergies   Patient has no known allergies.   Review of  Systems Review of Systems  Constitutional: Positive for fever. Negative for appetite change and chills.  HENT: Negative for congestion and sore throat.   Respiratory: Positive for cough and shortness of breath. Negative for wheezing.   Cardiovascular: Negative for chest pain, palpitations and leg swelling.  Gastrointestinal: Negative for abdominal pain, diarrhea, nausea and vomiting.  Genitourinary: Negative for dysuria and urgency.  Musculoskeletal: Negative for back pain, myalgias, neck pain and neck stiffness.  Skin:  Negative for rash and wound.  Allergic/Immunologic: Negative for immunocompromised state.  Neurological: Negative for dizziness, syncope, weakness and headaches.  Psychiatric/Behavioral: Negative for confusion.     Physical Exam Updated Vital Signs BP 121/84 (BP Location: Right Arm)    Pulse (!) 101    Temp 100.3 F (37.9 C) (Oral)    Resp 19    Ht  (1.676 m)    Wt 78.9 kg    SpO2 92%    BMI 28.08 kg/m   Physical Exam Vitals signs and nursing note reviewed.  Constitutional:      General: He is not in acute distress.    Appearance: He is well-developed. He is not ill-appearing, toxic-appearing or diaphoretic.  HENT:     Head: Normocephalic.  Eyes:     General: No scleral icterus.       Left eye: No discharge.     Conjunctiva/sclera: Conjunctivae normal.  Neck:     Musculoskeletal: Normal range of motion and neck supple.  Cardiovascular:     Rate and Rhythm: Regular rhythm. Tachycardia present.  Pulmonary:     Effort: Pulmonary effort is normal.     Comments: Speaks in complete, fluent sentences without tachypnea.  No air hunger.  No audible wheezes.  No retractions, accessory muscle use, or nasal flaring. Chest:     Chest wall: No tenderness.  Abdominal:     General: There is no distension.     Palpations: Abdomen is soft. There is no mass.     Tenderness: There is no abdominal tenderness. There is no right CVA tenderness, left CVA tenderness, guarding or rebound.     Hernia: No hernia is present.  Musculoskeletal:        General: No swelling, tenderness, deformity or signs of injury.     Right lower leg: No edema.     Left lower leg: No edema.  Skin:    General: Skin is warm and dry.     Findings: No bruising.  Neurological:     General: No focal deficit present.     Mental Status: He is alert.  Psychiatric:        Behavior: Behavior normal.    ED Treatments / Results  Labs (all labs ordered are listed, but only abnormal results are displayed) Labs Reviewed  - No data to display  EKG None  Radiology Dg Chest Portable 1 View  Result Date: 07/02/2018 CLINICAL DATA:  COVID-19 positive.  Shortness of breath. EXAM: PORTABLE CHEST 1 VIEW COMPARISON:  07/01/2018 FINDINGS: Patchy opacities in the lungs bilaterally have improved since prior study. Heart is normal size. No visible effusions or acute bony abnormality. IMPRESSION: Improving patchy bilateral airspace opacities. Electronically Signed   By: Charlett Nose M.D.   On: 07/02/2018 01:31   Portable Chest 1 View  Result Date: 07/01/2018 CLINICAL DATA:  48 year old male with shortness of breath and fever. EXAM: PORTABLE CHEST 1 VIEW COMPARISON:  05/29/2017. FINDINGS: Portable AP semi upright view at 0504 hours. Lower lung volumes. Mediastinal contours remain normal.  Visualized tracheal air column is within normal limits. Peripheral patchy and indistinct increased pulmonary opacity when compared to 2019, greater on the left. Relatively isolated involvement in the right mid lung. No superimposed pneumothorax, pulmonary edema or pleural effusion. Gas-filled but nondilated bowel loops in the upper abdomen. No osseous abnormality identified. IMPRESSION: Lower lung volumes with peripheral patchy and indistinct bilateral pulmonary opacities greater on the left. Consider viral/atypical respiratory infection. No pleural effusion. Electronically Signed   By: Odessa Fleming M.D.   On: 07/01/2018 05:26    Procedures Procedures (including critical care time)  Medications Ordered in ED Medications  acetaminophen (TYLENOL) tablet 650 mg (650 mg Oral Given 07/02/18 0049)  acetaminophen (TYLENOL) tablet 325 mg (325 mg Oral Given 07/02/18 0156)  albuterol (VENTOLIN HFA) 108 (90 Base) MCG/ACT inhaler 8 puff (8 puffs Inhalation Given 07/02/18 0156)     Initial Impression / Assessment and Plan / ED Course  I have reviewed the triage vital signs and the nursing notes.  Pertinent labs & imaging results that were available during  my care of the patient were reviewed by me and considered in my medical decision making (see chart for details).        48 year old male history of hypertension and allergic rhinitis presenting by EMS with shortness of breath.  He was seen in the ER yesterday and was tested positive for COVID-19.  On arrival, he is febrile to 103.  Heart rate is 101.  Heart rate is in the 90s on the cardiac monitor during my evaluation.  Tylenol was given.  The patient was also given albuterol inhaler with a spacer.  Chest x-ray with improving patchy opacities in the lungs bilaterally since chest x-ray yesterday.  The patient was discussed with Dr. Pollie Meyer, attending physician.  After reviewing the x-ray that has improved in the last 24 hours, she recommends adding doxycycline to azithromycin as there may be a component of bacterial pneumonia.  On reevaluation, the patient reports shortness of breath is improved.  During my initial exam, patient was satting at 98% with good waveform on the monitor.  He was ambulated in the room and maintained an SaO2 of 92%.  EMS also notes that the patient did not have any hypoxia in route.  He is not in respiratory distress and has no increased work of breathing.  He has not required supplemental oxygen since arrival in the ER.  Given overall clinical appearance, with improving chest x-ray, I do not feel that the patient warrants admission, even for observation, at this time.  I suspect his shortness of breath was worse tonight due to fever.  As fever resolved, his shortness of breath also significantly resolved.  We have also discussed how to appropriately take albuterol MDI at home.  I will discharge the patient home with symptomatic treatment in addition to your doxycycline and previously prescribed prescriptions.  I also recommended the patient purchase a pulse oximeter for home use.  He was given COVID-19 precautions for home.  All questions were answered.  Safe for discharge at  this time with strict return precautions to the ER.  Final Clinical Impressions(s) / ED Diagnoses   Final diagnoses:  2019 novel coronavirus disease (COVID-19)    ED Discharge Orders         Ordered    doxycycline (VIBRAMYCIN) 100 MG capsule  2 times daily     07/02/18 0236           Jeryl Umholtz A, PA-C 07/02/18 [phone]  Palumbo, April, MD 07/02/18 [phone]

## 2018-07-02 NOTE — Discharge Instructions (Addendum)
Thank you for allowing me to care for you today in the Emergency Department.   You should continue to self quarantine at home.  I have also attached instructions on how to keep other family members in your home from getting ill.  Continue to take the antibiotic azithromycin as prescribed.  Take 1 tablet of doxycycline 2 times daily for the next 7 days.  One of the most important things that you can do if you have COVID-19 is to get good control of your fever.  Take 650 mg of Tylenol once every 6 hours for fever control.  If you are having difficulty sleeping, you can try taking Benadryl, which is available over-the-counter.  You can also try taking Mucinex, which is available over-the-counter.  Make sure to look at the label of Mucinex.  If it contains acetaminophen, this is the generic name for Tylenol.  You should not take more than 4000 mg of Tylenol from all sources in a 24-hour period.  Avoid taking ibuprofen or other NSAIDs for fever since you have COVID-19.  You can purchase a pulse oximeter over-the-counter or order one online to monitor your oxygen at home.  Your oxygen in the ER was in ER was 92-98%.   If your symptoms worsen and you develop respiratory distress, a fever despite taking Tylenol as described above, persistent vomiting, if you stop making urine, or if you develop severe shortness of breath, return to the ER.

## 2018-07-03 ENCOUNTER — Emergency Department (HOSPITAL_COMMUNITY): Payer: HRSA Program

## 2018-07-03 ENCOUNTER — Other Ambulatory Visit: Payer: Self-pay

## 2018-07-03 ENCOUNTER — Inpatient Hospital Stay (HOSPITAL_COMMUNITY)
Admission: EM | Admit: 2018-07-03 | Discharge: 2018-07-04 | DRG: 177 | Disposition: A | Discharge status: Home / Self Care | Payer: HRSA Program | Attending: Internal Medicine | Admitting: Internal Medicine

## 2018-07-03 ENCOUNTER — Encounter (HOSPITAL_COMMUNITY): Payer: Self-pay | Admitting: Emergency Medicine

## 2018-07-03 DIAGNOSIS — R0602 Shortness of breath: Secondary | ICD-10-CM | POA: Diagnosis present

## 2018-07-03 DIAGNOSIS — J988 Other specified respiratory disorders: Secondary | ICD-10-CM | POA: Diagnosis not present

## 2018-07-03 DIAGNOSIS — E871 Hypo-osmolality and hyponatremia: Secondary | ICD-10-CM | POA: Diagnosis present

## 2018-07-03 DIAGNOSIS — J1289 Other viral pneumonia: Secondary | ICD-10-CM | POA: Diagnosis present

## 2018-07-03 DIAGNOSIS — R509 Fever, unspecified: Secondary | ICD-10-CM | POA: Diagnosis not present

## 2018-07-03 DIAGNOSIS — J9601 Acute respiratory failure with hypoxia: Secondary | ICD-10-CM

## 2018-07-03 DIAGNOSIS — A4189 Other specified sepsis: Secondary | ICD-10-CM | POA: Diagnosis present

## 2018-07-03 DIAGNOSIS — A419 Sepsis, unspecified organism: Secondary | ICD-10-CM | POA: Diagnosis present

## 2018-07-03 LAB — FERRITIN: Ferritin: 715 ng/mL — ABNORMAL HIGH (ref 24–336)

## 2018-07-03 LAB — MAGNESIUM: Magnesium: 2.3 mg/dL (ref 1.7–2.4)

## 2018-07-03 LAB — TRIGLYCERIDES: Triglycerides: 84 mg/dL (ref <150)

## 2018-07-03 LAB — CBC WITH DIFFERENTIAL/PLATELET
Abs Immature Granulocytes: 0.05 10*3/uL (ref 0.00–0.07)
Basophils Absolute: 0 10*3/uL (ref 0.0–0.1)
Basophils Relative: 0 %
Eosinophils Absolute: 0 10*3/uL (ref 0.0–0.5)
Eosinophils Relative: 0 %
HCT: 41.8 % (ref 39.0–52.0)
Hemoglobin: 14.9 g/dL (ref 13.0–17.0)
Immature Granulocytes: 1 %
Lymphocytes Relative: 8 %
Lymphs Abs: 0.7 10*3/uL (ref 0.7–4.0)
MCH: 32.3 pg (ref 26.0–34.0)
MCHC: 35.6 g/dL (ref 30.0–36.0)
MCV: 90.5 fL (ref 80.0–100.0)
Monocytes Absolute: 0.7 10*3/uL (ref 0.1–1.0)
Monocytes Relative: 9 %
Neutro Abs: 6.6 10*3/uL (ref 1.7–7.7)
Neutrophils Relative %: 82 %
Platelets: 272 10*3/uL (ref 150–400)
RBC: 4.62 MIL/uL (ref 4.22–5.81)
RDW: 11.5 % (ref 11.5–15.5)
WBC: 8 10*3/uL (ref 4.0–10.5)
nRBC: 0 % (ref 0.0–0.2)

## 2018-07-03 LAB — URINALYSIS, ROUTINE W REFLEX MICROSCOPIC
Bilirubin Urine: NEGATIVE
Glucose, UA: NEGATIVE mg/dL
Hgb urine dipstick: NEGATIVE
Ketones, ur: 80 mg/dL — AB
Leukocytes,Ua: NEGATIVE
Nitrite: NEGATIVE
Protein, ur: 100 mg/dL — AB
Specific Gravity, Urine: 1.026 (ref 1.005–1.030)
pH: 5 (ref 5.0–8.0)

## 2018-07-03 LAB — COMPREHENSIVE METABOLIC PANEL
ALT: 17 U/L (ref 0–44)
AST: 29 U/L (ref 15–41)
Albumin: 3.1 g/dL — ABNORMAL LOW (ref 3.5–5.0)
Alkaline Phosphatase: 35 U/L — ABNORMAL LOW (ref 38–126)
Anion gap: 13 (ref 5–15)
BUN: 7 mg/dL (ref 6–20)
CO2: 20 mmol/L — ABNORMAL LOW (ref 22–32)
Calcium: 8.5 mg/dL — ABNORMAL LOW (ref 8.9–10.3)
Chloride: 99 mmol/L (ref 98–111)
Creatinine, Ser: 1.06 mg/dL (ref 0.61–1.24)
GFR calc Af Amer: >60 mL/min (ref >60)
GFR calc non Af Amer: >60 mL/min (ref >60)
Glucose, Bld: 114 mg/dL — ABNORMAL HIGH (ref 70–99)
Potassium: 3.8 mmol/L (ref 3.5–5.1)
Sodium: 132 mmol/L — ABNORMAL LOW (ref 135–145)
Total Bilirubin: 1.4 mg/dL — ABNORMAL HIGH (ref 0.3–1.2)
Total Protein: 7.5 g/dL (ref 6.5–8.1)

## 2018-07-03 LAB — BLOOD GAS, ARTERIAL
Acid-base deficit: 2.6 mmol/L — ABNORMAL HIGH (ref 0.0–2.0)
Bicarbonate: 21.2 mmol/L (ref 20.0–28.0)
Drawn by: 350431
FIO2: 0.21
O2 Saturation: 96.8 %
Patient temperature: 98.6
pCO2 arterial: 33.8 mmHg (ref 32.0–48.0)
pH, Arterial: 7.414 (ref 7.350–7.450)
pO2, Arterial: 90.1 mmHg (ref 83.0–108.0)

## 2018-07-03 LAB — PROCALCITONIN: Procalcitonin: 0.22 ng/mL

## 2018-07-03 LAB — FIBRINOGEN: Fibrinogen: >800 mg/dL — ABNORMAL HIGH (ref 210–475)

## 2018-07-03 LAB — C-REACTIVE PROTEIN: CRP: 23.7 mg/dL — ABNORMAL HIGH (ref <1.0)

## 2018-07-03 LAB — SODIUM, URINE, RANDOM: Sodium, Ur: 16 mmol/L

## 2018-07-03 LAB — LACTATE DEHYDROGENASE: LDH: 291 U/L — ABNORMAL HIGH (ref 98–192)

## 2018-07-03 LAB — OSMOLALITY, URINE: Osmolality, Ur: 757 mOsm/kg (ref 300–900)

## 2018-07-03 LAB — D-DIMER, QUANTITATIVE: D-Dimer, Quant: 0.5 ug/mL-FEU (ref 0.00–0.50)

## 2018-07-03 LAB — LACTIC ACID, PLASMA: Lactic Acid, Venous: 1.1 mmol/L (ref 0.5–1.9)

## 2018-07-03 MED ORDER — ENOXAPARIN SODIUM 40 MG/0.4ML ~~LOC~~ SOLN
40.0000 mg | SUBCUTANEOUS | Status: DC
  Administered 2018-07-03 – 2018-07-04 (×2): 40 mg via SUBCUTANEOUS
  Filled 2018-07-03 (×2): qty 0.4

## 2018-07-03 MED ORDER — ACETAMINOPHEN 500 MG PO TABS
1000.0000 mg | ORAL_TABLET | Freq: Once | ORAL | Status: AC
  Administered 2018-07-03: 03:00:00 1000 mg via ORAL
  Filled 2018-07-03: qty 2

## 2018-07-03 MED ORDER — AZITHROMYCIN 250 MG PO TABS
250.0000 mg | ORAL_TABLET | Freq: Every day | ORAL | Status: DC
  Administered 2018-07-03 – 2018-07-04 (×2): 250 mg via ORAL
  Filled 2018-07-03 (×2): qty 1

## 2018-07-03 MED ORDER — HYDROXYCHLOROQUINE SULFATE 200 MG PO TABS
200.0000 mg | ORAL_TABLET | Freq: Two times a day (BID) | ORAL | Status: DC
  Administered 2018-07-04: 08:00:00 200 mg via ORAL
  Filled 2018-07-03: qty 1

## 2018-07-03 MED ORDER — ACETAMINOPHEN 325 MG PO TABS
650.0000 mg | ORAL_TABLET | Freq: Four times a day (QID) | ORAL | Status: DC | PRN
  Administered 2018-07-03 – 2018-07-04 (×2): 650 mg via ORAL
  Filled 2018-07-03 (×2): qty 2

## 2018-07-03 MED ORDER — HYDROXYCHLOROQUINE SULFATE 200 MG PO TABS
400.0000 mg | ORAL_TABLET | Freq: Two times a day (BID) | ORAL | Status: AC
  Administered 2018-07-03 (×2): 400 mg via ORAL
  Filled 2018-07-03 (×2): qty 2

## 2018-07-03 MED ORDER — VITAMIN C 500 MG/5ML PO SYRP
500.0000 mg | ORAL_SOLUTION | Freq: Every day | ORAL | Status: DC
  Filled 2018-07-03: qty 5

## 2018-07-03 MED ORDER — VITAMIN C 500 MG PO TABS
500.0000 mg | ORAL_TABLET | Freq: Every day | ORAL | Status: DC
  Administered 2018-07-03 – 2018-07-04 (×2): 500 mg via ORAL
  Filled 2018-07-03 (×2): qty 1

## 2018-07-03 MED ORDER — ALBUTEROL SULFATE HFA 108 (90 BASE) MCG/ACT IN AERS
1.0000 | INHALATION_SPRAY | Freq: Four times a day (QID) | RESPIRATORY_TRACT | Status: DC | PRN
  Administered 2018-07-03: 2 via RESPIRATORY_TRACT
  Filled 2018-07-03: qty 6.7

## 2018-07-03 MED ORDER — ACETAMINOPHEN 650 MG RE SUPP: 650.0000 mg | Freq: Four times a day (QID) | RECTAL | Status: DC | PRN

## 2018-07-03 MED ORDER — ZINC SULFATE 220 (50 ZN) MG PO CAPS
220.0000 mg | ORAL_CAPSULE | Freq: Every day | ORAL | Status: DC
  Administered 2018-07-03 – 2018-07-04 (×2): 220 mg via ORAL
  Filled 2018-07-03 (×2): qty 1

## 2018-07-03 NOTE — ED Provider Notes (Signed)
Emergency Department Provider Note   I have reviewed the triage vital signs and the nursing notes.   HISTORY  Chief Complaint Shortness of Breath   HPI Javier [name] is a 48 y.o. male who is here for the third time and as many days for worsening dyspnea.  Patient states that over the last few hours his shortness of breath is significantly increased.  It appears that yesterday he was satting 98% on room air and 92% with exertion and speaking in full sentences with normal vital signs otherwise.  Patient states compliance with his medications at home.  Started oxygen prior to arrival for reported hypoxia.  Patient still with fevers also describes some burning in his feet that is new for him.  No other changes.   No other associated or modifying symptoms.    History reviewed. No pertinent past medical history.  Patient Active Problem List   Diagnosis Date Noted  . Rhinitis, allergic 05/22/2015  . Elevated BP 05/22/2015    History reviewed. No pertinent surgical history.  Current Outpatient Rx  . Order #: [id number] Class: Print  . Order #: [id number] Class: Print  . Order #: [id number] Class: Print    Allergies Patient has no known allergies.  History reviewed. No pertinent family history.  Social History Social History   Tobacco Use  . Smoking status: Never Smoker  . Smokeless tobacco: Never Used  Substance Use Topics  . Alcohol use: Yes    Comment: socially  . Drug use: Not on file    Review of Systems  All other systems negative except as documented in the HPI. All pertinent positives and negatives as reviewed in the HPI. ____________________________________________   PHYSICAL EXAM:  VITAL SIGNS: Vitals:   07/03/18 0300 07/03/18 0330 07/03/18 0345 07/03/18 0415  BP: 116/82 107/76 109/78 106/79  Pulse: (!) 110 (!) 104 100 (!) 102  Resp: (!) 26 (!) 25 (!) 30 (!) 30  Temp:      TempSrc:      SpO2: 97% 98% 98% 98%  Weight:      Height:         Constitutional: Alert and oriented. Well appearing and in no acute distress. Eyes: Conjunctivae are normal. PERRL. EOMI. Head: Atraumatic. Nose: No congestion/rhinnorhea. Mouth/Throat: Mucous membranes are moist.  Oropharynx non-erythematous. Neck: No stridor.  No meningeal signs.   Cardiovascular: tachycardic rate, regular rhythm. Good peripheral circulation. Grossly normal heart sounds.   Respiratory: tachypneic respiratory effort. Not speaking in full sentences. Mild resp distress. Lungs CTAB. Gastrointestinal: Soft and nontender. No distention.  Musculoskeletal: No lower extremity tenderness nor edema. No gross deformities of extremities. Neurologic:  Normal speech and language. No gross focal neurologic deficits are appreciated.  Skin:  Skin is warm, dry and intact. No rash noted.   ____________________________________________   LABS (all labs ordered are listed, but only abnormal results are displayed)  Labs Reviewed  COMPREHENSIVE METABOLIC PANEL - Abnormal; Notable for the following components:      Result Value   Sodium 132 (*)    CO2 20 (*)    Glucose, Bld 114 (*)    Calcium 8.5 (*)    Albumin 3.1 (*)    Alkaline Phosphatase 35 (*)    Total Bilirubin 1.4 (*)    All other components within normal limits  CBC WITH DIFFERENTIAL/PLATELET   ____________________________________________  EKG   EKG Interpretation  Date/Time:    Ventricular Rate:    PR Interval:    QRS Duration:   QT Interval:  QTC Calculation:   R Axis:     Text Interpretation:         ____________________________________________  RADIOLOGY  Dg Chest Portable 1 View  Result Date: 07/03/2018 CLINICAL DATA:  Shortness of breath EXAM: PORTABLE CHEST 1 VIEW COMPARISON:  07/02/2018, 07/01/2018 FINDINGS: Slight worsening of patchy bilateral airspace opacities. No pleural effusion or pneumothorax. Cardiomediastinal contours are unchanged. IMPRESSION: Slight worsening of patchy bilateral airspace  opacities. Electronically Signed   By: Deatra RobinsonKevin  Herman M.D.   On: 07/03/2018 03:06    ____________________________________________   PROCEDURES  Procedure(s) performed:   Procedures  CRITICAL CARE Performed by: Marily MemosJason Aniston Christman Total critical care time: 35 minutes Critical care time was exclusive of separately billable procedures and treating other patients. Critical care was necessary to treat or prevent imminent or life-threatening deterioration. Critical care was time spent personally by me on the following activities: development of treatment plan with patient and/or surrogate as well as nursing, discussions with consultants, evaluation of patient's response to treatment, examination of patient, obtaining history from patient or surrogate, ordering and performing treatments and interventions, ordering and review of laboratory studies, ordering and review of radiographic studies, pulse oximetry and re-evaluation of patient's condition.  ____________________________________________   INITIAL IMPRESSION / ASSESSMENT AND PLAN / ED COURSE  Judging by the notes for last couple days and x-rays it does appear that the patient is having some worsening symptoms and likely worsening disease.  No indication for intubation at this time however he is tachypneic, not speaking in full sentences and has lower oxygen than previously.  [facility] however [facility] are [facility] so I will admit to hospitalist pending transfer over there if possible.  Pertinent labs & imaging results that were available during my care of the patient were reviewed by me and considered in my medical decision making (see chart for details).  ____________________________________________  FINAL CLINICAL IMPRESSION(S) / ED DIAGNOSES  Final diagnoses:  Acute respiratory failure with hypoxia (HCC)  COVID-19     MEDICATIONS GIVEN DURING THIS VISIT:  Medications  acetaminophen (TYLENOL) tablet 1,000  mg (1,000 mg Oral Given 07/03/18 0245)     NEW OUTPATIENT MEDICATIONS STARTED DURING THIS VISIT:  Current Discharge Medication List      Note:  This note was prepared with assistance of Dragon voice recognition software. Occasional wrong-word or sound-a-like substitutions may have occurred due to the inherent limitations of voice recognition software.   Jaimin Krupka, Barbara CowerJason, MD 07/03/18 [phone]

## 2018-07-03 NOTE — Progress Notes (Signed)
PROGRESS NOTE    Rayford Williamsen  ZOX:[id number] DOB: 1970/04/25 DOA: 07/03/2018 PCP: Patient, No Pcp Per    Brief Narrative: A 48 year old with no known past medical history who has presented multiple times to the ED complaining of shortness of breath and cough.  He was diagnosed with COVID-19 on 4/22. He has been evaluated in the ED multiple times for shortness of breath.  Last time he was discharged on  Azithromycin. He presented with worsening shortness of breath.  X-ray show worsening bilateral infiltrates.    Assessment & Plan:   Principal Problem:   COVID-19 Active Problems:   Sepsis (HCC)   Shortness of breath   Hyponatremia   Fever  1-COVID-19 positive, bilateral pneumonia: Acute hypoxic respiratory failure Patient currently on 2 L of oxygen. He report.discussed. I will continue with azithromycin , plaquenil.  Monitor QT closely I will continue with vitamin C and zinc Ferritin level 715, CRP 23, LDH 291, triglyceride 84, fibrinogen 800, d-dimer 0.5 He is HS score is 33 point less than 1% probability of hemophagocytic syndrome.  2-sepsis;  Likely related to COVID.  Patient presented with fever temperature 102, tachycardia 110, systolic blood pressure 101/72. PRN IVF bolus  3-hyponatremia: Mild encourage oral intake.  Estimated body mass index is 26.76 kg/m as calculated from the following:   Height as of this encounter:  (1.676 m).   Weight as of this encounter: 75.2 kg.   DVT prophylaxis: Lovenox Code Status: full code Family Communication: patient decline that I update family, he will do it.  [facility]position Plan[facility]. Patient aware and agree.    Consultants:   none   Procedures:   none   Antimicrobials:  Azithromycin.    Subjective: He report cough persist. Dyspnea some what improved.  We talk about pronning position for 2--3 hours BID  Objective: Vitals:   07/03/18 0530 07/03/18 0545  07/03/18 0614 07/03/18 0800  BP: 104/77 103/76 117/83 112/78  Pulse: 93 93 (!) 101 90  Resp: (!) 31 (!) Temp:   99 F (37.2 C) 98.9 F (37.2 C)  TempSrc:   Oral Oral  SpO2: 98% 98% 100% 99%  Weight:   75.2 kg   Height:    (1.676 m)     Intake/Output Summary (Last 24 hours) at 07/03/2018 1304 Last data filed at 07/03/2018 0900 Gross per 24 hour  Intake 240 ml  Output -  Net 240 ml   Filed Weights   07/03/18 0221 07/03/18 0614  Weight: 78.9 kg 75.2 kg    Examination:  General exam: Appears calm and comfortable  Respiratory system: bilateral crackles.  Cardiovascular system: S1 & S2 heard, RRR. No JVD, murmurs, rubs, gallops or clicks. No pedal edema. Gastrointestinal system: Abdomen is nondistended, soft and nontender. No organomegaly or masses felt. Normal bowel sounds heard. Central nervous system: Alert and oriented. No focal neurological deficits. Extremities: Symmetric 5 x 5 power. Skin: No rashes, lesions or ulcers   Data Reviewed: I have personally reviewed following labs and imaging studies  CBC: Recent Labs  Lab 07/01/18 0503 07/03/18 0250  WBC 4.8 8.0  NEUTROABS 2.7 6.6  HGB 16.3 14.9  HCT 46.0 41.8  MCV 92.6 90.5  PLT 209 272   Basic Metabolic Panel: Recent Labs  Lab 07/01/18 0503 07/03/18 0250  NA 132* 132*  K 3.8 3.8  CL 95* 99  CO2 19* 20*  GLUCOSE 164* 114*  BUN 10  7  CREATININE 1.10 1.06  CALCIUM 8.5* 8.5*  MG  --  2.3   GFR: Estimated Creatinine Clearance: 76.1 mL/min (by C-G formula based on SCr of 1.06 mg/dL). Liver Function Tests: Recent Labs  Lab 07/01/18 0503 07/03/18 0250  AST 29 29  ALT 18 17  ALKPHOS 32* 35*  BILITOT 0.9 1.4*  PROT 8.2* 7.5  ALBUMIN 3.4* 3.1*   No results for input(s): LIPASE, AMYLASE in the last 168 hours. No results for input(s): AMMONIA in the last 168 hours. Coagulation Profile: No results for input(s): INR, PROTIME in the last 168 hours. Cardiac Enzymes: Recent Labs  Lab  07/01/18 0503  TROPONINI <0.03   BNP (last 3 results) No results for input(s): PROBNP in the last 8760 hours. HbA1C: No results for input(s): HGBA1C in the last 72 hours. CBG: No results for input(s): GLUCAP in the last 168 hours. Lipid Profile: Recent Labs    07/03/18 1000  TRIG 84   Thyroid Function Tests: No results for input(s): TSH, T4TOTAL, FREET4, T3FREE, THYROIDAB in the last 72 hours. Anemia Panel: Recent Labs    07/03/18 1000  FERRITIN 715*   Sepsis Labs: Recent Labs  Lab 07/01/18 0503 07/03/18 0250 07/03/18 1000  PROCALCITON <0.10 0.22  --   LATICACIDVEN 1.3  --  1.1    Recent Results (from the past 240 hour(s))  SARS Coronavirus 2 [facility] order, Performed in [facility] hospital lab)     Status: Abnormal   Collection Time: 07/01/18  5:03 AM  Result Value Ref Range Status   SARS Coronavirus 2 POSITIVE (A) NEGATIVE Final    Comment: CRITICAL RESULT CALLED TO, READ BACK BY AND VERIFIED WITH: BLUE RN AT 0715 ON [id number] BY SJW (NOTE) If result is NEGATIVE SARS-CoV-2 target nucleic acids are NOT DETECTED. The SARS-CoV-2 RNA is generally detectable in upper and lower  respiratory specimens during the acute phase of infection. The lowest  concentration of SARS-CoV-2 viral copies this assay can detect is 250  copies / mL. A negative result does not preclude SARS-CoV-2 infection  and should not be used as the sole basis for treatment or other  patient management decisions.  A negative result may occur with  improper specimen collection / handling, submission of specimen other  than nasopharyngeal swab, presence of viral mutation(s) within the  areas targeted by this assay, and inadequate number of viral copies  (<250 copies / mL). A negative result must be combined with clinical  observations, patient history, and epidemiological information. If result is POSITIVE SARS-CoV-2 target nucleic acids are DETECT ED. The SARS-CoV-2 RNA is generally detectable in  upper and lower  respiratory specimens during the acute phase of infection.  Positive  results are indicative of active infection with SARS-CoV-2.  Clinical  correlation with patient history and other diagnostic information is  necessary to determine patient infection status.  Positive results do  not rule out bacterial infection or co-infection with other viruses. If result is PRESUMPTIVE POSTIVE SARS-CoV-2 nucleic acids MAY BE PRESENT.   A presumptive positive result was obtained on the submitted specimen  and confirmed on repeat testing.  While 2019 novel coronavirus  (SARS-CoV-2) nucleic acids may be present in the submitted sample  additional confirmatory testing may be necessary for epidemiological  and / or clinical management purposes  to differentiate between  SARS-CoV-2 and other Sarbecovirus currently known to infect humans.  If clinically indicated additional testing with an alternate test  methodology (LAB74 53) is advised. The SARS-CoV-2  RNA is generally  detectable in upper and lower respiratory specimens during the acute  phase of infection. The expected result is Negative. Fact Sheet for Patients:  BoilerBrush.com.cyhttps://www.fda.gov/media/136312/download Fact Sheet for Healthcare Providers: https://pope.com/https://www.fda.gov/media/136313/download This test is not yet approved or cleared by the [place] FDA and has been authorized for detection and/or diagnosis of SARS-CoV-2 by FDA under an Emergency Use Authorization (EUA).  This EUA will remain in effect (meaning this test can be used) for the duration of the COVID-19 declaration under Section 564(b)(1) of the Act, 21 U.S.C. section 360bbb-3(b)(1), unless the authorization is terminated or revoked sooner. Performed at [facility], [address] [place], [place] [address]   Culture, blood (Routine X 2) w Reflex to ID Panel     Status: None (Preliminary result)   Collection Time: 07/01/18  5:03 AM  Result Value Ref Range Status    Specimen Description BLOOD RIGHT ANTECUBITAL  Final   Special Requests   Final    BOTTLES DRAWN AEROBIC AND ANAEROBIC Blood Culture adequate volume   Culture   Final    NO GROWTH 2 DAYS Performed at [facility], [address] [place], [place] [address]    Report Status PENDING  Incomplete  Culture, blood (Routine X 2) w Reflex to ID Panel     Status: None (Preliminary result)   Collection Time: 07/01/18  5:05 AM  Result Value Ref Range Status   Specimen Description BLOOD RIGHT HAND  Final   Special Requests   Final    BOTTLES DRAWN AEROBIC AND ANAEROBIC Blood Culture adequate volume   Culture   Final    NO GROWTH 2 DAYS Performed at [facility], [address] [place], [place] [address]    Report Status PENDING  Incomplete  Urine culture     Status: None   Collection Time: 07/01/18  5:05 AM  Result Value Ref Range Status   Specimen Description URINE, RANDOM  Final   Special Requests NONE  Final   Culture   Final    NO GROWTH Performed at [facility], [address] [place], [place] [address]    Report Status 07/02/2018 FINAL  Final         Radiology Studies: Dg Chest Portable 1 View  Result Date: 07/03/2018 CLINICAL DATA:  Shortness of breath EXAM: PORTABLE CHEST 1 VIEW COMPARISON:  07/02/2018, 07/01/2018 FINDINGS: Slight worsening of patchy bilateral airspace opacities. No pleural effusion or pneumothorax. Cardiomediastinal contours are unchanged. IMPRESSION: Slight worsening of patchy bilateral airspace opacities. Electronically Signed   By: Deatra RobinsonKevin  Herman M.D.   On: 07/03/2018 03:06   Dg Chest Portable 1 View  Result Date: 07/02/2018 CLINICAL DATA:  COVID-19 positive.  Shortness of breath. EXAM: PORTABLE CHEST 1 VIEW COMPARISON:  07/01/2018 FINDINGS: Patchy opacities in the lungs bilaterally have improved since prior study. Heart is normal size. No visible effusions or acute bony abnormality. IMPRESSION: Improving patchy bilateral airspace  opacities. Electronically Signed   By: Charlett NoseKevin  Dover M.D.   On: 07/02/2018 01:31        Scheduled Meds: . ascorbic acid  500 mg Oral Daily  . azithromycin  250 mg Oral Daily  . enoxaparin (LOVENOX) injection  40 mg Subcutaneous Q24H  . [START ON 07/04/2018] hydroxychloroquine  200 mg Oral BID  . hydroxychloroquine  400 mg Oral BID  . zinc sulfate  220 mg Oral Daily   Continuous Infusions:   LOS: 0 days  Time spent: 35 minutes    Alba Cory, MD [facility] Pager [phone]  If 7PM-7AM, please contact night-coverage [email or web address] Password [email or web address] 07/03/2018, 1:04 PM

## 2018-07-03 NOTE — ED Triage Notes (Signed)
Pt confirmed COVID returns with c/o increased SHOB. Pt was here yesterday and the day before w/ similar sx. EMS put pt on 6L of 02, maintaining 100%. Pt denies CP, N/V/D, and abdominal pain. Reduced O2 to 2.5 L, maintaining 98%. Pt has T of 102.

## 2018-07-03 NOTE — Progress Notes (Signed)
Received to room 2W32 from ER via stretcher. Assisted to bed and positioned for comfort. Oriented to room, bed and unit.

## 2018-07-03 NOTE — H&P (Addendum)
History and Physical    PLEASE NOTE THAT DRAGON DICTATION SOFTWARE WAS USED IN THE CONSTRUCTION OF THIS NOTE.   Javier [name] ERX:[id number] DOB: 26-Dec-1970 DOA: 07/03/2018  PCP: Patient, No Pcp Per Patient coming from: home  I have personally briefly reviewed patient's old medical records in [place]  Chief Complaint: Shortness of breath  HPI: Javier [name] is a 48 y.o. male who is admitted to [facility] on 07/03/2018 with sepsis due to COVID-19 after presenting from home to [facility] ED complaining of worsening shortness of breath.  The patient reports progressive shortness of breath over the course of the last week, resulting in presentation to [facility] ED each of the last 3 days, including today.  He reports the shortness of breath has been associated with objective fever at home, with temperature ranging between 101-102 over that time, in addition to new onset nonproductive cough and generalized myalgias. He also reports mild rhinorrhea in the absence of sore throat or rash, denies any associated abdominal pain, nausea, vomiting, diarrhea, dysuria, gross hematuria, headache, or neck stiffness.  Denies chest pain, palpitations, hemoptysis, peripheral edema, leg erythema, or calf tenderness he denies any known or suspected COVID-19 exposures, and denies any recent traveling.  The patient denies any history of chronic medical conditions, including no history of chronic underlying lung disease.  He reports that he is a lifelong non-smoker.   In the setting of shortness of breath and fever, the patient originally presented to [facility] emergency department on 07/01/2018, with COVID testing performed at that time subsequently found to be positive.  At that time, chest x-ray was suggestive of bilateral patchy airspace opacities.  Procalcitonin was found to be negative. oxygen saturations were noted to be in the mid to high 90s on room air. He was prescribed a azithromycin and a PRN albuterol  inhaler, and discharged to home with instructions to quarantine at that location.  Yesterday, on 07/02/2018, patient returned to [facility] emergency department for further evaluation in the setting of progression of his shortness of breath, still in the context of objective fever and nonproductive cough.  At that time, he was still demonstrating oxygen saturations in the mid to high 90's on RA, and chest x-ray continued to demonstrate bilateral patchy airspace opacities, without any significant worsening relative to plain films of the chest performed 1 day prior.  On 07/02/2018, doxycycline was added to existing azithromycin to expand coverage for bacterial pneumonia, and the patient was once again discharged to home.   Today, 07/03/18, the patient returns to [facility] emergency department complaining of interval progression of shortness of breath as well as persistence of objective fever and nonproductive cough.  Reports good interval compliance with aforementioned azithromycin and doxycycline.  He has used his PRN albuterol inhaler 2-3 times over the last 1 to 2 days, but reports no resultant improvement in his breathing.     ED Course: Vital signs in the emergency department today were notable for the following: Temperature max 102.1; heart rate 98-1 10; blood pressure 106/79-121/84; RR 25-30; oxygen saturation 97 to 98% on 2.5 L nasal cannula.  Labs performed in the ED today were notable for the following: CMP notable for sodium 132 compared to 132 on 07/01/2018, potassium 3.8, bicarbonate 20, anion gap 13, creatinine 1.06 compared to 1.10 on 07/01/2018.  CBC notable for white blood cell count of 8000 with percent neutrophils, hemoglobin 14.9, platelets 272.  One-view CXR per final radiology report and in comparison to plain  films of the chest performed on 07/01/2018 and 07/02/2018 showed slight interval worsening of patchy bilateral airspace opacities.  EKG performed today showed sinus tachycardia with  heart rate 110, QTc 447 ms, and no evidence of acute ischemic changes.  While still in the emergency department, the patient received acetaminophen 1 g p.o. x1.  Subsequently, he was admitted for further evaluation management of sepsis due to COVID-19.    Review of Systems: As per HPI otherwise 10 point review of systems negative.   History reviewed. No pertinent past medical history.   Surgical History: (The patient denies any prior surgical procedures).    Social History:  reports that he has never smoked. He has never used smokeless tobacco. He reports current alcohol use. No history on file for drug.   No Known Allergies  History reviewed. No pertinent family history.  (The patient denies any family history of chronic underlying pulmonary conditions).     Prior to Admission medications   Medication Sig Start Date End Date Taking? Authorizing Provider  albuterol (VENTOLIN HFA) 108 (90 Base) MCG/ACT inhaler Inhale 1-2 puffs into the lungs every 6 (six) hours as needed for wheezing. 07/01/18  Yes Larene Pickett, PA-C  azithromycin (ZITHROMAX) 250 MG tablet Take 1 tablet (250 mg total) by mouth daily. Take first 2 tablets together, then 1 every day until finished. 07/01/18  Yes Larene Pickett, PA-C  doxycycline (VIBRAMYCIN) 100 MG capsule Take 1 capsule (100 mg total) by mouth 2 (two) times daily. 07/02/18  Yes McDonald, Mia A, PA-C     Objective    Physical Exam: Vitals:   07/03/18 0300 07/03/18 0330 07/03/18 0345 07/03/18 0415  BP: 116/82 107/76 109/78 106/79  Pulse: (!) 110 (!) 104 100 (!) 102  Resp: (!) 26 (!) 25 (!) 30 (!) 30  Temp:      TempSrc:      SpO2: 97% 98% 98% 98%  Weight:      Height:        General: appears to be stated age; alert, oriented Skin: warm, dry, no rash Head:  AT/[place] Mouth:  Oral mucosa membranes appear moist, normal dentition Neck: supple; trachea midline Heart:  Mildly tachycardic, but regular; did not appreciate any M/R/G Lungs:  slightly diminished bibasilar breath sounds, but otherwise CTAB, without wheezes, rales, or rhonchi Abdomen: + BS; soft, ND, NT Extremities: no peripheral edema, no muscle wasting   Labs on Admission: I have personally reviewed following labs and imaging studies  CBC: Recent Labs  Lab 07/01/18 0503 07/03/18 0250  WBC 4.8 8.0  NEUTROABS 2.7 6.6  HGB 16.3 14.9  HCT 46.0 41.8  MCV 92.6 90.5  PLT 209 366   Basic Metabolic Panel: Recent Labs  Lab 07/01/18 0503 07/03/18 0250  NA 132* 132*  K 3.8 3.8  CL 95* 99  CO2 19* 20*  GLUCOSE 164* 114*  BUN 10 7  CREATININE 1.10 1.06  CALCIUM 8.5* 8.5*   GFR: Estimated Creatinine Clearance: 83.2 mL/min (by C-G formula based on SCr of 1.06 mg/dL). Liver Function Tests: Recent Labs  Lab 07/01/18 0503 07/03/18 0250  AST 29 29  ALT 18 17  ALKPHOS 32* 35*  BILITOT 0.9 1.4*  PROT 8.2* 7.5  ALBUMIN 3.4* 3.1*   No results for input(s): LIPASE, AMYLASE in the last 168 hours. No results for input(s): AMMONIA in the last 168 hours. Coagulation Profile: No results for input(s): INR, PROTIME in the last 168 hours. Cardiac Enzymes: Recent Labs  Lab 07/01/18  0503  TROPONINI <0.03   BNP (last 3 results) No results for input(s): PROBNP in the last 8760 hours. HbA1C: No results for input(s): HGBA1C in the last 72 hours. CBG: No results for input(s): GLUCAP in the last 168 hours. Lipid Profile: No results for input(s): CHOL, HDL, LDLCALC, TRIG, CHOLHDL, LDLDIRECT in the last 72 hours. Thyroid Function Tests: No results for input(s): TSH, T4TOTAL, FREET4, T3FREE, THYROIDAB in the last 72 hours. Anemia Panel: No results for input(s): VITAMINB12, FOLATE, FERRITIN, TIBC, IRON, RETICCTPCT in the last 72 hours. Urine analysis:    Component Value Date/Time   COLORURINE YELLOW 07/01/2018 0505   APPEARANCEUR CLEAR 07/01/2018 0505   LABSPEC 1.008 07/01/2018 0505   PHURINE 6.0 07/01/2018 0505   GLUCOSEU NEGATIVE 07/01/2018 0505   HGBUR  SMALL (A) 07/01/2018 0505   BILIRUBINUR NEGATIVE 07/01/2018 0505   KETONESUR 5 (A) 07/01/2018 0505   PROTEINUR NEGATIVE 07/01/2018 0505   NITRITE NEGATIVE 07/01/2018 0505   LEUKOCYTESUR NEGATIVE 07/01/2018 0505    Radiological Exams on Admission: Dg Chest Portable 1 View  Result Date: 07/03/2018 CLINICAL DATA:  Shortness of breath EXAM: PORTABLE CHEST 1 VIEW COMPARISON:  07/02/2018, 07/01/2018 FINDINGS: Slight worsening of patchy bilateral airspace opacities. No pleural effusion or pneumothorax. Cardiomediastinal contours are unchanged. IMPRESSION: Slight worsening of patchy bilateral airspace opacities. Electronically Signed   By: Ulyses Jarred M.D.   On: 07/03/2018 03:06   Dg Chest Portable 1 View  Result Date: 07/02/2018 CLINICAL DATA:  COVID-19 positive.  Shortness of breath. EXAM: PORTABLE CHEST 1 VIEW COMPARISON:  07/01/2018 FINDINGS: Patchy opacities in the lungs bilaterally have improved since prior study. Heart is normal size. No visible effusions or acute bony abnormality. IMPRESSION: Improving patchy bilateral airspace opacities. Electronically Signed   By: Rolm Baptise M.D.   On: 07/02/2018 01:31   Portable Chest 1 View  Result Date: 07/01/2018 CLINICAL DATA:  48 year old male with shortness of breath and fever. EXAM: PORTABLE CHEST 1 VIEW COMPARISON:  05/29/2017. FINDINGS: Portable AP semi upright view at 0504 hours. Lower lung volumes. Mediastinal contours remain normal. Visualized tracheal air column is within normal limits. Peripheral patchy and indistinct increased pulmonary opacity when compared to 2019, greater on the left. Relatively isolated involvement in the right mid lung. No superimposed pneumothorax, pulmonary edema or pleural effusion. Gas-filled but nondilated bowel loops in the upper abdomen. No osseous abnormality identified. IMPRESSION: Lower lung volumes with peripheral patchy and indistinct bilateral pulmonary opacities greater on the left. Consider viral/atypical  respiratory infection. No pleural effusion. Electronically Signed   By: Genevie Ann M.D.   On: 07/01/2018 05:26     Assessment/Plan   Javier [name] is a 48 y.o. male who is admitted to [facility] on 07/03/2018 with sepsis due to COVID-19 after presenting from home to [facility] ED complaining of worsening shortness of breath.   Principal Problem:   COVID-19 Active Problems:   Sepsis ([place])   Shortness of breath   Hyponatremia   Fever    #) COVID-19: Diagnosis on the basis of 1 week of progressive shortness of breath associated with rhinorrhea, nonproductive cough, and objective fever, with COVID-19 testing performed on 06/29/2018 found to be positive.  The patient has presented to [facility] ED each of the last 3 nights complaining of progression of shortness of breath, and was noted to demonstrate increased work of breathing/tachypnea this evening that was not felt to be present on his 2 prior presentations to the ED.  While no objective evidence of  hypoxia, the patient is maintaining oxygen saturations in the mid to high 90s on 2.5 L nasal cannula, this evening's chest x-ray demonstrates interval worsening of patchy bilateral airspace opacities, concerning for progression of COVID-19 and associated inflammatory response to such.  Consequently, decision was made to admit the patient for further evaluation and management of COVID-19 in setting of corresponding sepsis.  Of note, the patient denies any known or suspected COVID-19 exposures, and denies any chronic medical conditions, including no chronic underlying pulmonary conditions.  Of note, today's EKG is notable for QTc of 447 ms. Considered low-risk COVID-19 at this time.  Plan: In setting of progression of shortness of breath, interval development of tachypnea, and chest x-ray demonstrating interval worsening of patchy bilateral airspace opacities, will initiate hydroxychloroquine.  Specifically, will order loading dose of hydroxychloroquine 400  mg p.o. twice daily x 2 doses, followed by 200 mg p.o. twice daily x8 additional doses.  I have ordered a follow-up EKG to occur at midnight on 07/05/2018, corresponding with 2 hours after completion of hydroxychloroquine loading dose in order to monitor QTc interval.  Subsequent to that, QAM EKG's have been ordered to further monitor subsequent QTc interval.  Daily CBCs to monitor for development of blood dyscrasias as a result of hydroxychloroquine.  Check ABG now for the purpose of evaluating PaO2 to FiO2 ratio in the setting of progression of patchy bilateral airspace opacities.  Monitor continuous pulse oximetry, on telemetry.  PRN acetaminophen for fever.  PRN albuterol inhaler.  Add on serum magnesium level to labs already collected in the ED. we will continue a azithromycin that was initiated on 07/01/2022 atypical coverage as well as anti-inflammatory benefits.  Will repeat procalcitonin at this time, and if this remains nonelevated, will refrain from additional doxycycline as a azithromycin should provide adequate atypical bacterial coverage. A transfer order to [facility] has been placed.      #) Sepsis: Appears to be on the basis of COVID-19, as above, with Sirs criteria met via presenting objective fever, tachycardia, and tachypnea.  In the absence of associated evidence of endorgan damage, patient sepsis does not meet criteria to be considered severe in nature at this time.  No evidence of associated hypotension thus far.  No evidence of additional underlying infectious process beyond COVID-19, and bacterial pneumonia is felt to be less likely.  As patient's sepsis is felt to be viral in nature, will refrain from expanding antibiotic coverage beyond the previously described azithromycin.  Plan: Check lactic acid.  Check blood cultures x2.  Repeat CBC with differential tomorrow.  Check urinalysis.  Repeat procalcitonin, as above.  PRN acetaminophen for fever.     #) Acute hypoosmolar hyponatremia:  This evening's labs reflect a serum sodium level of 132, which is unchanged from value per labs drawn on 07/01/2018.  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   Plan: Check random urine sodium as well as urine osmolality.  Monitor strict I's and O's.  Repeat BMP tomorrow morning.  Work-up and management of sepsis due to COVID-19, as further described above.    DVT prophylaxis: Lovenox 40 mg [place] Qdaily.  Code Status: full Family Communication: (none) Disposition Plan: Per Rounding Team Consults called: (none)  Admission status: Inpatient; med telemetry    PLEASE NOTE THAT DRAGON DICTATION SOFTWARE WAS USED IN THE CONSTRUCTION OF THIS NOTE.   Rhetta Mura DO [facility] Pager [phone] From [place].     07/03/2018, 4:30 AM

## 2018-07-03 NOTE — ED Notes (Signed)
ED TO INPATIENT HANDOFF REPORT  ED Nurse Name and Phone #:  Alan Ripper [phone]  S Name/Age/Gender Javier [name] 48 y.o. male Room/Bed: 023C/023C  Code Status   Code Status: Full Code  Home/SNF/Other Home Patient oriented to: self, place, time and situation Is this baseline? Yes   Triage Complete: Triage complete  Chief Complaint covid pt  Triage Note Pt confirmed COVID returns with c/o increased SHOB. Pt was here yesterday and the day before w/ similar sx. EMS put pt on 6L of 02, maintaining 100%. Pt denies CP, N/V/D, and abdominal pain. Reduced O2 to 2.5 L, maintaining 98%. Pt has T of 102.   Allergies No Known Allergies  Level of Care/Admitting Diagnosis ED Disposition    ED Disposition Condition Comment   Admit  Hospital Area: [facility] [[id number]]  Level of Care: Telemetry Medical [104]  Covid Evaluation: Confirmed COVID Positive  Isolation Risk Level: Low Risk  (Less than 4L [place] supplementation)  Diagnosis: COVID-19 [[id number]]  Admitting Physician: Angie Fava [[id number]]  Attending Physician: Angie Fava [[id number]]  Estimated length of stay: past midnight tomorrow  Certification:: I certify this patient will need inpatient services for at least 2 midnights  PT Class (Do Not Modify): Inpatient [101]  PT Acc Code (Do Not Modify): Private [1]       B Medical/Surgery History History reviewed. No pertinent past medical history. History reviewed. No pertinent surgical history.   A IV Location/Drains/Wounds Patient Lines/Drains/Airways Status   Active Line/Drains/Airways    Name:   Placement date:   Placement time:   Site:   Days:   Peripheral IV 07/03/18 Left Forearm   07/03/18    0254    Forearm   less than 1          Intake/Output Last 24 hours No intake or output data in the 24 hours ending 07/03/18 0545  Labs/Imaging Results for orders placed or performed during the hospital encounter of 07/03/18 (from the past 48  hour(s))  CBC with Differential     Status: None   Collection Time: 07/03/18  2:50 AM  Result Value Ref Range   WBC 8.0 4.0 - 10.5 K/uL   RBC 4.62 4.22 - 5.81 MIL/uL   Hemoglobin 14.9 13.0 - 17.0 g/dL   HCT 09.4 07.6 - 80.8 %   MCV 90.5 80.0 - 100.0 fL   MCH 32.3 26.0 - 34.0 pg   MCHC 35.6 30.0 - 36.0 g/dL   RDW 81.1 03.1 - 59.4 %   Platelets 272 150 - 400 K/uL   nRBC 0.0 0.0 - 0.2 %   Neutrophils Relative % 82 %   Neutro Abs 6.6 1.7 - 7.7 K/uL   Lymphocytes Relative 8 %   Lymphs Abs 0.7 0.7 - 4.0 K/uL   Monocytes Relative 9 %   Monocytes Absolute 0.7 0.1 - 1.0 K/uL   Eosinophils Relative 0 %   Eosinophils Absolute 0.0 0.0 - 0.5 K/uL   Basophils Relative 0 %   Basophils Absolute 0.0 0.0 - 0.1 K/uL   Immature Granulocytes 1 %   Abs Immature Granulocytes 0.05 0.00 - 0.07 K/uL    Comment: Performed at [facility], [address], [place], [place] [address]  Comprehensive metabolic panel     Status: Abnormal   Collection Time: 07/03/18  2:50 AM  Result Value Ref Range   Sodium 132 (L) 135 - 145 mmol/L   Potassium 3.8 3.5 - 5.1 mmol/L   Chloride  99 98 - 111 mmol/L   CO2 20 (L) 22 - 32 mmol/L   Glucose, Bld 114 (H) 70 - 99 mg/dL   BUN 7 6 - 20 mg/dL   Creatinine, Ser 6.04 0.61 - 1.24 mg/dL   Calcium 8.5 (L) 8.9 - 10.3 mg/dL   Total Protein 7.5 6.5 - 8.1 g/dL   Albumin 3.1 (L) 3.5 - 5.0 g/dL   AST 29 15 - 41 U/L   ALT 17 0 - 44 U/L   Alkaline Phosphatase 35 (L) 38 - 126 U/L   Total Bilirubin 1.4 (H) 0.3 - 1.2 mg/dL   GFR calc non Af Amer >60 >60 mL/min   GFR calc Af Amer >60 >60 mL/min   Anion gap 13 5 - 15    Comment: Performed at [facility], [address] [place], [place] [address]   Dg Chest Portable 1 View  Result Date: 07/03/2018 CLINICAL DATA:  Shortness of breath EXAM: PORTABLE CHEST 1 VIEW COMPARISON:  07/02/2018, 07/01/2018 FINDINGS: Slight worsening of patchy bilateral airspace opacities. No pleural effusion or pneumothorax. Cardiomediastinal  contours are unchanged. IMPRESSION: Slight worsening of patchy bilateral airspace opacities. Electronically Signed   By: Deatra Robinson M.D.   On: 07/03/2018 03:06   Dg Chest Portable 1 View  Result Date: 07/02/2018 CLINICAL DATA:  COVID-19 positive.  Shortness of breath. EXAM: PORTABLE CHEST 1 VIEW COMPARISON:  07/01/2018 FINDINGS: Patchy opacities in the lungs bilaterally have improved since prior study. Heart is normal size. No visible effusions or acute bony abnormality. IMPRESSION: Improving patchy bilateral airspace opacities. Electronically Signed   By: Charlett Nose M.D.   On: 07/02/2018 01:31    Pending Labs Unresulted Labs (From admission, onward)    Start     Ordered   07/04/18 0500  HIV antibody (Routine Testing)  Tomorrow morning,   R     07/03/18 0524   07/04/18 0500  Magnesium  Tomorrow morning,   R     07/03/18 0524   07/04/18 0500  CBC WITH DIFFERENTIAL  Tomorrow morning,   R     07/03/18 0524   07/04/18 0500  Basic metabolic panel  Tomorrow morning,   R     07/03/18 0524   07/03/18 0525  Magnesium  Add-on,   R     07/03/18 0524   07/03/18 0523  Blood gas, arterial  Once,   R    Comments:  (assessing PaO2:FiO2)    07/03/18 0524          Vitals/Pain Today's Vitals   07/03/18 0430 07/03/18 0500 07/03/18 0515 07/03/18 0530  BP:  104/78 101/72 104/77  Pulse:  92 91 93  Resp:  (!) 28 (!) 29 (!) 31  Temp: (!) 100.4 F (38 C)     TempSrc: Oral     SpO2:  97% 98% 98%  Weight:      Height:      PainSc:        Isolation Precautions Droplet and Contact precautions  Medications Medications  azithromycin (ZITHROMAX) tablet 250 mg (has no administration in time range)  albuterol (VENTOLIN HFA) 108 (90 Base) MCG/ACT inhaler 1-2 puff (has no administration in time range)  enoxaparin (LOVENOX) injection 40 mg (has no administration in time range)  acetaminophen (TYLENOL) tablet 650 mg (has no administration in time range)    Or  acetaminophen (TYLENOL) suppository  650 mg (has no administration in time range)  acetaminophen (TYLENOL) tablet 1,000 mg (1,000 mg Oral Given  07/03/18 0245)    Mobility walks Low fall risk   Focused Assessments Pulmonary Assessment Handoff:  Lung sounds:   O2 Device: Nasal Cannula O2 Flow Rate (L/min): 2.5 L/min      R Recommendations: See Admitting Provider Note  Report given to:   Additional Notes:

## 2018-07-04 DIAGNOSIS — E871 Hypo-osmolality and hyponatremia: Secondary | ICD-10-CM

## 2018-07-04 MED ORDER — ACETAMINOPHEN 325 MG PO TABS: 650.0000 mg | ORAL_TABLET | Freq: Four times a day (QID) | ORAL | Status: DC | PRN

## 2018-07-04 MED ORDER — ZINC SULFATE 220 (50 ZN) MG PO CAPS: 220.0000 mg | ORAL_CAPSULE | Freq: Every day | ORAL | Status: DC

## 2018-07-04 MED ORDER — ASCORBIC ACID 500 MG PO TABS: 500.0000 mg | ORAL_TABLET | Freq: Every day | ORAL | Status: DC

## 2018-07-04 NOTE — Progress Notes (Signed)
Patient spiked a temp of 101.3. temp reported to provider via Amion

## 2018-07-04 NOTE — Discharge Instructions (Signed)
-Please take Tylenol for fever, and avoid NSAIDs -Continue with prone position(lay on your abdomen while sleeping and laying flat instead of your back) for the next few days. -Please monitor your pulse oximetry closely, call ED if it is persistently less than 90%.     Person Under Monitoring Name: Javier [name]  Location[place] [address] [place] [id number]   Infection Prevention Recommendations for Individuals Confirmed to have, or Being Evaluated for, 2019 Novel Coronavirus (COVID-19) Infection Who Receive Care at Home  Individuals who are confirmed to have, or are being evaluated for, COVID-19 should follow the prevention steps below until a healthcare provider or local or state health department says they can return to normal activities.  Stay home except to get medical care You should restrict activities outside your home, except for getting medical care. Do not go to work, school, or public areas, and do not use public transportation or taxis.  Call ahead before visiting your doctor Before your medical appointment, call the healthcare provider and tell them that you have, or are being evaluated for, COVID-19 infection. This will help the healthcare providers office take steps to keep other people from getting infected. Ask your healthcare provider to call the local or state health department.  Monitor your symptoms Seek prompt medical attention if your illness is worsening (e.g., difficulty breathing). Before going to your medical appointment, call the healthcare provider and tell them that you have, or are being evaluated for, COVID-19 infection. Ask your healthcare provider to call the local or state health department.  Wear a facemask You should wear a facemask that covers your nose and mouth when you are in the same room with other people and when you visit a healthcare provider. People who live with or visit you should also wear a facemask while they are in  the same room with you.  Separate yourself from other people in your home As much as possible, you should stay in a different room from other people in your home. Also, you should use a separate bathroom, if available.  Avoid sharing household items You should not share dishes, drinking glasses, cups, eating utensils, towels, bedding, or other items with other people in your home. After using these items, you should wash them thoroughly with soap and water.  Cover your coughs and sneezes Cover your mouth and nose with a tissue when you cough or sneeze, or you can cough or sneeze into your sleeve. Throw used tissues in a lined trash can, and immediately wash your hands with soap and water for at least 20 seconds or use an alcohol-based hand rub.  Wash your [employer]hands Wash your hands often and thoroughly with soap and water for at least 20 seconds. You can use an alcohol-based hand sanitizer if soap and water are not available and if your hands are not visibly dirty. Avoid touching your eyes, nose, and mouth with unwashed hands.   Prevention Steps for Caregivers and Household Members of Individuals Confirmed to have, or Being Evaluated for, COVID-19 Infection Being Cared for in the Home  If you live with, or provide care at home for, a person confirmed to have, or being evaluated for, COVID-19 infection please follow these guidelines to prevent infection:  Follow healthcare providers instructions Make sure that you understand and can help the patient follow any healthcare provider instructions for all care.  Provide for the patients basic needs You should help the patient with basic needs in the home and provide  support for getting groceries, prescriptions, and other personal needs.  Monitor the patients symptoms If they are getting sicker, call his or her medical provider and tell them that the patient has, or is being evaluated for, COVID-19 infection. This will help the healthcare  providers office take steps to keep other people from getting infected. Ask the healthcare provider to call the local or state health department.  Limit the number of people who have contact with the patient  If possible, have only one caregiver for the patient.  Other household members should stay in another home or place of residence. If this is not possible, they should stay  in another room, or be separated from the patient as much as possible. Use a separate bathroom, if available.  Restrict visitors who do not have an essential need to be in the home.  Keep older adults, very young children, and other sick people away from the patient Keep older adults, very young children, and those who have compromised immune systems or chronic health conditions away from the patient. This includes people with chronic heart, lung, or kidney conditions, diabetes, and cancer.  Ensure good ventilation Make sure that shared spaces in the home have good air flow, such as from an air conditioner or an opened window, weather permitting.  Wash your hands often  Wash your hands often and thoroughly with soap and water for at least 20 seconds. You can use an alcohol based hand sanitizer if soap and water are not available and if your hands are not visibly dirty.  Avoid touching your eyes, nose, and mouth with unwashed hands.  Use disposable paper towels to dry your hands. If not available, use dedicated cloth towels and replace them when they become wet.  Wear a facemask and gloves  Wear a disposable facemask at all times in the room and gloves when you touch or have contact with the patients blood, body fluids, and/or secretions or excretions, such as sweat, saliva, sputum, nasal mucus, vomit, urine, or feces.  Ensure the mask fits over your nose and mouth tightly, and do not touch it during use.  Throw out disposable facemasks and gloves after using them. Do not reuse.  Wash your hands immediately  after removing your facemask and gloves.  If your personal clothing becomes contaminated, carefully remove clothing and launder. Wash your hands after handling contaminated clothing.  Place all used disposable facemasks, gloves, and other waste in a lined container before disposing them with other household waste.  Remove gloves and wash your hands immediately after handling these items.  Do not share dishes, glasses, or other household items with the patient  Avoid sharing household items. You should not share dishes, drinking glasses, cups, eating utensils, towels, bedding, or other items with a patient who is confirmed to have, or being evaluated for, COVID-19 infection.  After the person uses these items, you should wash them thoroughly with soap and water.  Wash laundry thoroughly  Immediately remove and wash clothes or bedding that have blood, body fluids, and/or secretions or excretions, such as sweat, saliva, sputum, nasal mucus, vomit, urine, or feces, on them.  Wear gloves when handling laundry from the patient.  Read and follow directions on labels of laundry or clothing items and detergent. In general, wash and dry with the warmest temperatures recommended on the label.  Clean all areas the individual has used often  Clean all touchable surfaces, such as counters, tabletops, doorknobs, bathroom fixtures, toilets, phones, keyboards, tablets,  and bedside tables, every day. Also, clean any surfaces that may have blood, body fluids, and/or secretions or excretions on them.  Wear gloves when cleaning surfaces the patient has come in contact with.  Use a diluted bleach solution (e.g., dilute bleach with 1 part bleach and 10 parts water) or a household disinfectant with a label that says EPA-registered for coronaviruses. To make a bleach solution at home, add 1 tablespoon of bleach to 1 quart (4 cups) of water. For a larger supply, add  cup of bleach to 1 gallon (16 cups) of  water.  Read labels of cleaning products and follow recommendations provided on product labels. Labels contain instructions for safe and effective use of the cleaning product including precautions you should take when applying the product, such as wearing gloves or eye protection and making sure you have good ventilation during use of the product.  Remove gloves and wash hands immediately after cleaning.  Monitor yourself for signs and symptoms of illness Caregivers and household members are considered close contacts, should monitor their health, and will be asked to limit movement outside of the home to the extent possible. Follow the monitoring steps for close contacts listed on the symptom monitoring form.   ? If you have additional questions, contact your local health department or call the epidemiologist on call at [phone] (available 24/7). ? This guidance is subject to change. For the most up-to-date guidance from [facility], please refer to their website: [email or web address]

## 2018-07-04 NOTE — Discharge Summary (Signed)
Javier [name], is a 48 y.o. male  DOB 1971-02-28  MRN [id number].  Admission date:  07/03/2018  Admitting Physician  Angie Fava, DO  Discharge Date:  07/04/2018   Primary MD  Patient, No Pcp Per  Recommendations for primary care physician for things to follow:  - please check CBC, CMP during next visit -Patient was given instructions of [facility] Department of Health infection prevention recommendation for COVID-19, acknowledged understanding.   Admission Diagnosis  Acute respiratory failure with hypoxia (HCC) [J96.01] COVID-19 [U07.1, J98.8]   Discharge Diagnosis  Acute respiratory failure with hypoxia (HCC) [J96.01] COVID-19 [U07.1, J98.8]    Principal Problem:   COVID-19 Active Problems:   Sepsis (HCC)   Shortness of breath   Hyponatremia   Fever      History reviewed. No pertinent past medical history.  History reviewed. No pertinent surgical history.     History of present illness and  Hospital Course:     Kindly see H&P for history of present illness and admission details, please review complete Labs, Consult reports and Test reports for all details in brief  HPI  from the history and physical done on the day of admission  HPI: Javier [name] is a 48 y.o. male who is admitted to [facility] on 07/03/2018 with sepsis due to COVID-19 after presenting from home to [facility] ED complaining of worsening shortness of breath.  The patient reports progressive shortness of breath over the course of the last week, resulting in presentation to [facility] ED each of the last 3 days, including today.  He reports the shortness of breath has been associated with objective fever at home, with temperature ranging between 101-102 over that time, in addition to new onset nonproductive cough and generalized myalgias. He also reports mild rhinorrhea in the absence of sore throat or rash, denies any  associated abdominal pain, nausea, vomiting, diarrhea, dysuria, gross hematuria, headache, or neck stiffness.  Denies chest pain, palpitations, hemoptysis, peripheral edema, leg erythema, or calf tenderness he denies any known or suspected COVID-19 exposures, and denies any recent traveling.  The patient denies any history of chronic medical conditions, including no history of chronic underlying lung disease.  He reports that he is a lifelong non-smoker.   In the setting of shortness of breath and fever, the patient originally presented to [facility] emergency department on 07/01/2018, with COVID testing performed at that time subsequently found to be positive.  At that time, chest x-ray was suggestive of bilateral patchy airspace opacities.  Procalcitonin was found to be negative. oxygen saturations were noted to be in the mid to high 90s on room air. He was prescribed a azithromycin and a PRN albuterol inhaler, and discharged to home with instructions to quarantine at that location.  Yesterday, on 07/02/2018, patient returned to [facility] emergency department for further evaluation in the setting of progression of his shortness of breath, still in the context of objective fever and nonproductive cough.  At that time, he was still demonstrating oxygen  saturations in the mid to high 90's on RA, and chest x-ray continued to demonstrate bilateral patchy airspace opacities, without any significant worsening relative to plain films of the chest performed 1 day prior.  On 07/02/2018, doxycycline was added to existing azithromycin to expand coverage for bacterial pneumonia, and the patient was once again discharged to home.   Today, 07/03/18, the patient returns to [facility] emergency department complaining of interval progression of shortness of breath as well as persistence of objective fever and nonproductive cough.  Reports good interval compliance with aforementioned azithromycin and doxycycline.  He has  used his PRN albuterol inhaler 2-3 times over the last 1 to 2 days, but reports no resultant improvement in his breathing.     ED Course: Vital signs in the emergency department today were notable for the following: Temperature max 102.1; heart rate 98-1 10; blood pressure 106/79-121/84; RR 25-30; oxygen saturation 97 to 98% on 2.5 L nasal cannula.  Labs performed in the ED today were notable for the following: CMP notable for sodium 132 compared to 132 on 07/01/2018, potassium 3.8, bicarbonate 20, anion gap 13, creatinine 1.06 compared to 1.10 on 07/01/2018.  CBC notable for white blood cell count of 8000 with percent neutrophils, hemoglobin 14.9, platelets 272.  One-view CXR per final radiology report and in comparison to plain films of the chest performed on 07/01/2018 and 07/02/2018 showed slight interval worsening of patchy bilateral airspace opacities.  EKG performed today showed sinus tachycardia with heart rate 110, QTc 447 ms, and no evidence of acute ischemic changes.  While still in the emergency department, the patient received acetaminophen 1 g p.o. x1.  Subsequently, he was admitted for further evaluation management of sepsis due to COVID-19.    Hospital Course   Sepsis due to COVID-19 -Presents with fever, tachypnea and tachycardia, this is secondary to COVID-19 pneumonia, as evident with worsening bilateral opacities on x-ray on admission, initially requiring oxygen even though with no documented hypoxia, patient was kept on azithromycin, was started on Plaquenil during hospital stay, morning upon evaluation, patient reports he is feeling much improved, anemia has significantly subsided, till having intermittent low fever, and was ambulated in the room on room air multiple times, with no recurrence of hypoxia, discussed with the patient, be able to be discharged home today, no indication to continue azithromycin or Plaquenil on discharge, encouraged to continue with awake prone  position, incentive spirometry use, and to monitor his pulse ox intermittently to take Tylenol for fever (fever 101.3 overnight), and to contact ED if he is having worsening hypoxia. -Patient was admitted as an inpatient, visually he was symptomatic with dyspnea secondary to COVID-19 on presentation, but he did improve quicker than would have been anticipated .   Hyponatremia -Mild, asymptomatic  Discharge Condition:  stable   Follow UP  Follow-up Information    [facility] Follow up on 07/10/2018.   Why:  2:10 telehealth apt via phone Contact information: [address] [place] [address] [phone]            Discharge Instructions  and  Discharge Medications    Discharge Instructions    Discharge instructions   Complete by:  As directed    Regular diet   Increase activity slowly   Complete by:  As directed    MyChart COVID-19 home monitoring program   Complete by:  Jul 04, 2018    Is the patient willing to use a smartphone for  remote monitoring via the MyChart app?:  Yes   Temperature monitoring   Complete by:  Jul 04, 2018    After how many days would you like to receive a notification of this patient's flowsheet entries?:  1     Allergies as of 07/04/2018   No Known Allergies     Medication List    STOP taking these medications   azithromycin 250 MG tablet Commonly known as:  ZITHROMAX   cetirizine 10 MG tablet Commonly known as:  ZYRTEC   doxycycline 100 MG capsule Commonly known as:  VIBRAMYCIN   mometasone 50 MCG/ACT nasal spray Commonly known as:  Nasonex     TAKE these medications   acetaminophen 325 MG tablet Commonly known as:  TYLENOL Take 2 tablets (650 mg total) by mouth every 6 (six) hours as needed for mild pain or fever (or Fever >/= 101).   albuterol 108 (90 Base) MCG/ACT inhaler Commonly known as:  VENTOLIN HFA Inhale 1-2 puffs into the lungs every 6 (six) hours as needed for  wheezing.   ascorbic acid 500 MG tablet Commonly known as:  VITAMIN C Take 1 tablet (500 mg total) by mouth daily. Please take for 2 weeks Start taking on:  July 05, 2018   zinc sulfate 220 (50 Zn) MG capsule Take 1 capsule (220 mg total) by mouth daily. Please take for 2 days Start taking on:  July 05, 2018         Diet and Activity recommendation: See Discharge Instructions above   Consults obtained -  None   Major procedures and Radiology Reports - PLEASE review detailed and final reports for all details, in brief -    Dg Chest Portable 1 View  Result Date: 07/03/2018 CLINICAL DATA:  Shortness of breath EXAM: PORTABLE CHEST 1 VIEW COMPARISON:  07/02/2018, 07/01/2018 FINDINGS: Slight worsening of patchy bilateral airspace opacities. No pleural effusion or pneumothorax. Cardiomediastinal contours are unchanged. IMPRESSION: Slight worsening of patchy bilateral airspace opacities. Electronically Signed   By: Deatra Robinson M.D.   On: 07/03/2018 03:06   Dg Chest Portable 1 View  Result Date: 07/02/2018 CLINICAL DATA:  COVID-19 positive.  Shortness of breath. EXAM: PORTABLE CHEST 1 VIEW COMPARISON:  07/01/2018 FINDINGS: Patchy opacities in the lungs bilaterally have improved since prior study. Heart is normal size. No visible effusions or acute bony abnormality. IMPRESSION: Improving patchy bilateral airspace opacities. Electronically Signed   By: Charlett Nose M.D.   On: 07/02/2018 01:31   Portable Chest 1 View  Result Date: 07/01/2018 CLINICAL DATA:  48 year old male with shortness of breath and fever. EXAM: PORTABLE CHEST 1 VIEW COMPARISON:  05/29/2017. FINDINGS: Portable AP semi upright view at 0504 hours. Lower lung volumes. Mediastinal contours remain normal. Visualized tracheal air column is within normal limits. Peripheral patchy and indistinct increased pulmonary opacity when compared to 2019, greater on the left. Relatively isolated involvement in the right mid lung. No  superimposed pneumothorax, pulmonary edema or pleural effusion. Gas-filled but nondilated bowel loops in the upper abdomen. No osseous abnormality identified. IMPRESSION: Lower lung volumes with peripheral patchy and indistinct bilateral pulmonary opacities greater on the left. Consider viral/atypical respiratory infection. No pleural effusion. Electronically Signed   By: Odessa Fleming M.D.   On: 07/01/2018 05:26    Micro Results    Recent Results (from the past 240 hour(s))  SARS Coronavirus 2 [facility] order, Performed in [facility] hospital lab)     Status: Abnormal   Collection Time: 07/01/18  5:03 AM  Result Value Ref Range Status   SARS Coronavirus 2 POSITIVE (A) NEGATIVE Final    Comment: CRITICAL RESULT CALLED TO, READ BACK BY AND VERIFIED WITH: BLUE RN AT 0715 ON [id number] BY SJW (NOTE) If result is NEGATIVE SARS-CoV-2 target nucleic acids are NOT DETECTED. The SARS-CoV-2 RNA is generally detectable in upper and lower  respiratory specimens during the acute phase of infection. The lowest  concentration of SARS-CoV-2 viral copies this assay can detect is 250  copies / mL. A negative result does not preclude SARS-CoV-2 infection  and should not be used as the sole basis for treatment or other  patient management decisions.  A negative result may occur with  improper specimen collection / handling, submission of specimen other  than nasopharyngeal swab, presence of viral mutation(s) within the  areas targeted by this assay, and inadequate number of viral copies  (<250 copies / mL). A negative result must be combined with clinical  observations, patient history, and epidemiological information. If result is POSITIVE SARS-CoV-2 target nucleic acids are DETECT ED. The SARS-CoV-2 RNA is generally detectable in upper and lower  respiratory specimens during the acute phase of infection.  Positive  results are indicative of active infection with SARS-CoV-2.  Clinical  correlation with patient  history and other diagnostic information is  necessary to determine patient infection status.  Positive results do  not rule out bacterial infection or co-infection with other viruses. If result is PRESUMPTIVE POSTIVE SARS-CoV-2 nucleic acids MAY BE PRESENT.   A presumptive positive result was obtained on the submitted specimen  and confirmed on repeat testing.  While 2019 novel coronavirus  (SARS-CoV-2) nucleic acids may be present in the submitted sample  additional confirmatory testing may be necessary for epidemiological  and / or clinical management purposes  to differentiate between  SARS-CoV-2 and other Sarbecovirus currently known to infect humans.  If clinically indicated additional testing with an alternate test  methodology (LAB74 53) is advised. The SARS-CoV-2 RNA is generally  detectable in upper and lower respiratory specimens during the acute  phase of infection. The expected result is Negative. Fact Sheet for Patients:  BoilerBrush.com.cyhttps://www.fda.gov/media/136312/download Fact Sheet for Healthcare Providers: https://pope.com/https://www.fda.gov/media/136313/download This test is not yet approved or cleared by the [place] FDA and has been authorized for detection and/or diagnosis of SARS-CoV-2 by FDA under an Emergency Use Authorization (EUA).  This EUA will remain in effect (meaning this test can be used) for the duration of the COVID-19 declaration under Section 564(b)(1) of the Act, 21 U.S.C. section 360bbb-3(b)(1), unless the authorization is terminated or revoked sooner. Performed at [facility], [address] [place], [place] [address]   Culture, blood (Routine X 2) w Reflex to ID Panel     Status: None (Preliminary result)   Collection Time: 07/01/18  5:03 AM  Result Value Ref Range Status   Specimen Description BLOOD RIGHT ANTECUBITAL  Final   Special Requests   Final    BOTTLES DRAWN AEROBIC AND ANAEROBIC Blood Culture adequate volume   Culture   Final    NO GROWTH 2  DAYS Performed at [facility], [address] [place], [place] [address]    Report Status PENDING  Incomplete  Culture, blood (Routine X 2) w Reflex to ID Panel     Status: None (Preliminary result)   Collection Time: 07/01/18  5:05 AM  Result Value Ref Range Status   Specimen Description BLOOD RIGHT HAND  Final  Special Requests   Final    BOTTLES DRAWN AEROBIC AND ANAEROBIC Blood Culture adequate volume   Culture   Final    NO GROWTH 2 DAYS Performed at [facility], [address] [place], [place] [address]    Report Status PENDING  Incomplete  Urine culture     Status: None   Collection Time: 07/01/18  5:05 AM  Result Value Ref Range Status   Specimen Description URINE, RANDOM  Final   Special Requests NONE  Final   Culture   Final    NO GROWTH Performed at [facility], [address], [place], [place] [address]    Report Status 07/02/2018 FINAL  Final       Today   Subjective:   Javier [name] today has no headache,no chest or abdominal pain, ports is feeling better today, dyspnea has improved, had fever 101.3 overnight .  Objective:   Blood pressure 103/78, pulse 95, temperature 98.3 F (36.8 C), temperature source Oral, resp. rate (!) 24, height  (1.676 m), weight 75.2 kg, SpO2 92 %.   Intake/Output Summary (Last 24 hours) at 07/04/2018 0959 Last data filed at 07/03/2018 2117 Gross per 24 hour  Intake 720 ml  Output 1250 ml  Net -530 ml    Exam Awake Alert, Oriented x 3, No new F.N deficits, Normal affect Symmetrical Chest wall movement, Good air movement bilaterally, bibasilar Rales RRR,No Gallops,Rubs or new Murmurs, No Parasternal Heave +ve B.Sounds, Abd Soft, Non tender, No rebound -guarding or rigidity. No Cyanosis, Clubbing or edema, No new Rash or bruise  Data Review   CBC w Diff:  Lab Results  Component Value Date   WBC 8.0 07/03/2018   HGB 14.9 07/03/2018   HGB 15.5 05/29/2017   HCT 41.8 07/03/2018   HCT 43.9  05/29/2017   PLT 272 07/03/2018   PLT 288 05/29/2017   LYMPHOPCT 8 07/03/2018   MONOPCT 9 07/03/2018   EOSPCT 0 07/03/2018   BASOPCT 0 07/03/2018    CMP:  Lab Results  Component Value Date   NA 132 (L) 07/03/2018   NA 142 05/29/2017   K 3.8 07/03/2018   CL 99 07/03/2018   CO2 20 (L) 07/03/2018   BUN 7 07/03/2018   BUN 9 05/29/2017   CREATININE 1.06 07/03/2018   CREATININE 0.86 05/22/2015   PROT 7.5 07/03/2018   ALBUMIN 3.1 (L) 07/03/2018   BILITOT 1.4 (H) 07/03/2018   ALKPHOS 35 (L) 07/03/2018   AST 29 07/03/2018   ALT 17 07/03/2018  .   Total Time in preparing paper work, data evaluation and todays exam - 35 minutes  Huey Bienenstock M.D on 07/04/2018 at 9:59 AM  [facility]   Office  [phone]

## 2018-07-04 NOTE — Progress Notes (Signed)
Patient discharged . Discharge instructions and Incentive Spirometer given to patient. He will be picked up by family at front entrance of hospital.

## 2018-07-04 NOTE — TOC Transition Note (Signed)
Transition of Care [facility]) - CM/SW Discharge Note   Patient Details  Name: Javier [name] MRN: [id number] Date of Birth: 1970/09/29  [facility] CM/SW Contact:  Colleen Can RN, BSN, NCM-BC, ACM-RN [phone] (working remotely) Phone Number: 07/04/2018, 10:38 AM   Clinical Narrative:    CM spoke to patient via phone to discuss dispositional needs. CM reviewed patients discharge medications, with patient stating his ability to afford his medications. Hospital follow-up appointment was arranged with [facility] on 07/10/18 at 1410 (telehealth appointment). CM informed patient of the appointment, with the patient verbalizing understanding. No further needs from CM.     Final next level of care: Home/Self Care Barriers to Discharge: No Barriers Identified   Patient Goals and CMS Choice Patient states their goals for this hospitalization and ongoing recovery are:: "To return home"   Choice offered to / list presented to : NA

## 2018-07-05 ENCOUNTER — Encounter (INDEPENDENT_AMBULATORY_CARE_PROVIDER_SITE_OTHER): Payer: Self-pay

## 2018-07-06 ENCOUNTER — Encounter (INDEPENDENT_AMBULATORY_CARE_PROVIDER_SITE_OTHER): Payer: Self-pay

## 2018-07-06 LAB — CULTURE, BLOOD (ROUTINE X 2)
Culture: NO GROWTH
Culture: NO GROWTH
Special Requests: ADEQUATE
Special Requests: ADEQUATE

## 2018-07-07 ENCOUNTER — Encounter (INDEPENDENT_AMBULATORY_CARE_PROVIDER_SITE_OTHER): Payer: Self-pay

## 2018-07-07 ENCOUNTER — Other Ambulatory Visit: Payer: Self-pay

## 2018-07-07 ENCOUNTER — Encounter (HOSPITAL_COMMUNITY): Payer: Self-pay | Admitting: Emergency Medicine

## 2018-07-07 ENCOUNTER — Emergency Department (HOSPITAL_COMMUNITY): Payer: Self-pay

## 2018-07-07 ENCOUNTER — Emergency Department (HOSPITAL_COMMUNITY)
Admission: EM | Admit: 2018-07-07 | Discharge: 2018-07-08 | Disposition: A | Discharge status: Home / Self Care | Payer: Self-pay | Attending: Emergency Medicine | Admitting: Emergency Medicine

## 2018-07-07 DIAGNOSIS — G4700 Insomnia, unspecified: Secondary | ICD-10-CM | POA: Insufficient documentation

## 2018-07-07 DIAGNOSIS — R0602 Shortness of breath: Secondary | ICD-10-CM

## 2018-07-07 DIAGNOSIS — F419 Anxiety disorder, unspecified: Secondary | ICD-10-CM | POA: Insufficient documentation

## 2018-07-07 DIAGNOSIS — B342 Coronavirus infection, unspecified: Secondary | ICD-10-CM

## 2018-07-07 LAB — D-DIMER, QUANTITATIVE: D-Dimer, Quant: 0.58 ug/mL-FEU — ABNORMAL HIGH (ref 0.00–0.50)

## 2018-07-07 LAB — COMPREHENSIVE METABOLIC PANEL
ALT: 35 U/L (ref 0–44)
AST: 31 U/L (ref 15–41)
Albumin: 2.9 g/dL — ABNORMAL LOW (ref 3.5–5.0)
Alkaline Phosphatase: 53 U/L (ref 38–126)
Anion gap: 10 (ref 5–15)
BUN: 7 mg/dL (ref 6–20)
CO2: 24 mmol/L (ref 22–32)
Calcium: 8.2 mg/dL — ABNORMAL LOW (ref 8.9–10.3)
Chloride: 102 mmol/L (ref 98–111)
Creatinine, Ser: 0.8 mg/dL (ref 0.61–1.24)
GFR calc Af Amer: >60 mL/min (ref >60)
GFR calc non Af Amer: >60 mL/min (ref >60)
Glucose, Bld: 99 mg/dL (ref 70–99)
Potassium: 3.9 mmol/L (ref 3.5–5.1)
Sodium: 136 mmol/L (ref 135–145)
Total Bilirubin: 0.6 mg/dL (ref 0.3–1.2)
Total Protein: 8 g/dL (ref 6.5–8.1)

## 2018-07-07 LAB — PROCALCITONIN: Procalcitonin: <0.1 ng/mL

## 2018-07-07 LAB — CBC WITH DIFFERENTIAL/PLATELET
Abs Immature Granulocytes: 0.13 10*3/uL — ABNORMAL HIGH (ref 0.00–0.07)
Basophils Absolute: 0.1 10*3/uL (ref 0.0–0.1)
Basophils Relative: 1 %
Eosinophils Absolute: 0.1 10*3/uL (ref 0.0–0.5)
Eosinophils Relative: 2 %
HCT: 39.9 % (ref 39.0–52.0)
Hemoglobin: 13.5 g/dL (ref 13.0–17.0)
Immature Granulocytes: 2 %
Lymphocytes Relative: 23 %
Lymphs Abs: 1.3 10*3/uL (ref 0.7–4.0)
MCH: 32.1 pg (ref 26.0–34.0)
MCHC: 33.8 g/dL (ref 30.0–36.0)
MCV: 94.8 fL (ref 80.0–100.0)
Monocytes Absolute: 1.3 10*3/uL — ABNORMAL HIGH (ref 0.1–1.0)
Monocytes Relative: 21 %
Neutro Abs: 3.1 10*3/uL (ref 1.7–7.7)
Neutrophils Relative %: 51 %
Platelets: 525 10*3/uL — ABNORMAL HIGH (ref 150–400)
RBC: 4.21 MIL/uL — ABNORMAL LOW (ref 4.22–5.81)
RDW: 11.9 % (ref 11.5–15.5)
WBC: 5.9 10*3/uL (ref 4.0–10.5)
nRBC: 0 % (ref 0.0–0.2)

## 2018-07-07 LAB — FIBRINOGEN: Fibrinogen: >800 mg/dL — ABNORMAL HIGH (ref 210–475)

## 2018-07-07 LAB — C-REACTIVE PROTEIN: CRP: 17 mg/dL — ABNORMAL HIGH (ref <1.0)

## 2018-07-07 LAB — TRIGLYCERIDES: Triglycerides: 74 mg/dL (ref <150)

## 2018-07-07 LAB — LACTATE DEHYDROGENASE: LDH: 264 U/L — ABNORMAL HIGH (ref 98–192)

## 2018-07-07 LAB — LACTIC ACID, PLASMA: Lactic Acid, Venous: 1.4 mmol/L (ref 0.5–1.9)

## 2018-07-07 LAB — FERRITIN: Ferritin: 964 ng/mL — ABNORMAL HIGH (ref 24–336)

## 2018-07-07 MED ORDER — LORAZEPAM 2 MG/ML IJ SOLN
1.0000 mg | Freq: Once | INTRAMUSCULAR | Status: AC
  Administered 2018-07-07: 1 mg via INTRAVENOUS
  Filled 2018-07-07: qty 1

## 2018-07-07 MED ORDER — SODIUM CHLORIDE (PF) 0.9 % IJ SOLN
INTRAMUSCULAR | Status: AC
  Filled 2018-07-07: qty 50

## 2018-07-07 NOTE — ED Notes (Signed)
ED Provider at bedside. 

## 2018-07-07 NOTE — ED Triage Notes (Addendum)
Per EMS, patient from home, c/o SOB x20 minutes. COVID + on 4/22. O2 98% on RA. Admitted at [facility].

## 2018-07-07 NOTE — ED Notes (Signed)
XR at bedside

## 2018-07-07 NOTE — ED Provider Notes (Signed)
11:30 PM  Assumed care from Dr. Broadus John.  Pt is a 48 y.o. M with COVID 19 who presents to ED with SOB and insomnia.  No hypoxia here.  CXR shows increased multifocal confluent opacities.  CT chest pending to evaluate for PE.  1:10 AM  Pt's CT scan shows no pulmonary embolus.  Patient able to ambulate in the room and sats did not drop below 94%.  His case was previously discussed with hospitalist, Dr. Mikeal Hawthorne.  ED physician and hospitalist felt patient could be discharged home.  He is requesting something to go home with the help with insomnia.  Have recommended melatonin and we will also discharge with prescription of Atarax.  Recommended obtaining a pulse oximeter for home.  I reviewed all nursing notes, vitals, pertinent previous records, EKGs, lab and urine results, imaging (as available).    Fong Mccarry, Layla Maw, DO 07/08/18 0109

## 2018-07-07 NOTE — ED Notes (Signed)
Bed: BX03 Expected date:  Expected time:  Means of arrival:  Comments: 48 yr old male, short of breath, + COVID

## 2018-07-07 NOTE — ED Provider Notes (Signed)
[facility]-EMERGENCY DEPT Provider Note   CSN: [id number] Arrival date & time: 07/07/18  2025    History   Chief Complaint Chief Complaint  Patient presents with  . Shortness of Breath    HPI Javier [name] is a 48 y.o. male.     HPI Patient was admitted with coronavirus and discharged 3 days ago.  He reports that he has been taking all of his medications as prescribed.  He states that he has been careful to eat and drink adequately.  He reports he had a pretty good day day before yesterday, however he is having episodes of sometimes getting very short of breath very quickly.  He reports this evening he felt quite short of breath and had a hard time catching his breath.  No significant worsening of chest pain.  No abdominal pain or vomiting.  Reports he is having a lot of trouble sleeping.  He feels very tired. History reviewed. No pertinent past medical history.  Patient Active Problem List   Diagnosis Date Noted  . COVID-19 07/03/2018  . Sepsis (HCC) 07/03/2018  . Shortness of breath 07/03/2018  . Hyponatremia 07/03/2018  . Fever 07/03/2018  . Rhinitis, allergic 05/22/2015  . Elevated BP 05/22/2015    History reviewed. No pertinent surgical history.      Home Medications    Prior to Admission medications   Medication Sig Start Date End Date Taking? Authorizing Provider  acetaminophen (TYLENOL) 325 MG tablet Take 2 tablets (650 mg total) by mouth every 6 (six) hours as needed for mild pain or fever (or Fever >/= 101). 07/04/18  Yes Elgergawy, Leana Roe, MD  albuterol (VENTOLIN HFA) 108 (90 Base) MCG/ACT inhaler Inhale 1-2 puffs into the lungs every 6 (six) hours as needed for wheezing. 07/01/18  Yes Garlon Hatchet, PA-C  vitamin C (VITAMIN C) 500 MG tablet Take 1 tablet (500 mg total) by mouth daily. Please take for 2 weeks 07/05/18  Yes Elgergawy, Leana Roe, MD  zinc sulfate 220 (50 Zn) MG capsule Take 1 capsule (220 mg total) by mouth daily. Please  take for 2 days 07/05/18  Yes Elgergawy, Leana Roe, MD    Family History No family history on file.  Social History Social History   Tobacco Use  . Smoking status: Never Smoker  . Smokeless tobacco: Never Used  Substance Use Topics  . Alcohol use: Yes    Comment: socially  . Drug use: Not on file     Allergies   Patient has no known allergies.   Review of Systems Review of Systems 10 Systems reviewed and are negative for acute change except as noted in the HPI.  Physical Exam Updated Vital Signs BP 123/83   Pulse 90   Temp 98.7 F (37.1 C) (Oral)   Resp (!) 28   SpO2 99%   Physical Exam Constitutional:      Comments: Alert.  Clear mental status.  Well-nourished well-developed.  Mild tachypnea.  HENT:     Head: Normocephalic and atraumatic.  Eyes:     Extraocular Movements: Extraocular movements intact.  Cardiovascular:     Rate and Rhythm: Normal rate and regular rhythm.  Pulmonary:     Comments: Mild tachypnea.  Occasional paroxysmal cough with deep inspiration.  Lung sounds soft with occasional crackle. Abdominal:     General: There is no distension.     Palpations: Abdomen is soft.     Tenderness: There is no abdominal tenderness. There is no guarding.  Musculoskeletal: Normal range of motion.        General: No swelling or tenderness.     Right lower leg: No edema.     Left lower leg: No edema.  Skin:    General: Skin is warm and dry.  Neurological:     General: No focal deficit present.     Mental Status: He is oriented to person, place, and time.     Cranial Nerves: No cranial nerve deficit.     Coordination: Coordination normal.  Psychiatric:        Mood and Affect: Mood normal.      ED Treatments / Results  Labs (all labs ordered are listed, but only abnormal results are displayed) Labs Reviewed  CBC WITH DIFFERENTIAL/PLATELET - Abnormal; Notable for the following components:      Result Value   RBC 4.21 (*)    Platelets 525 (*)     Monocytes Absolute 1.3 (*)    Abs Immature Granulocytes 0.13 (*)    All other components within normal limits  COMPREHENSIVE METABOLIC PANEL - Abnormal; Notable for the following components:   Calcium 8.2 (*)    Albumin 2.9 (*)    All other components within normal limits  LACTATE DEHYDROGENASE - Abnormal; Notable for the following components:   LDH 264 (*)    All other components within normal limits  CULTURE, BLOOD (ROUTINE X 2)  CULTURE, BLOOD (ROUTINE X 2)  LACTIC ACID, PLASMA  TRIGLYCERIDES  LACTIC ACID, PLASMA  D-DIMER, QUANTITATIVE (NOT AT [facility])  PROCALCITONIN  FERRITIN  FIBRINOGEN  C-REACTIVE PROTEIN    EKG EKG Interpretation  Date/Time:  Tuesday July 07 2018 20:41:21 EDT Ventricular Rate:  92 PR Interval:    QRS Duration: 89 QT Interval:  370 QTC Calculation: 458 R Axis:   -6 Text Interpretation:  Sinus rhythm Low voltage, precordial leads Borderline T abnormalities, inferior leads no sig change from previous Confirmed by Arby Barrette [phone]) on 07/07/2018 10:39:19 PM   Radiology Dg Chest Port 1 View  Result Date: 07/07/2018 CLINICAL DATA:  Worsening shortness of breath. COVID-19 virus infection. EXAM: PORTABLE CHEST 1 VIEW COMPARISON:  07/03/2018 FINDINGS: The heart size and mediastinal contours are within normal limits. Increased multifocal confluent opacities are seen in the lower lung zones bilaterally compared to prior study. No evidence of pleural effusion. IMPRESSION: Increased multifocal confluent opacities in both lower lung zones. Electronically Signed   By: Myles Rosenthal M.D.   On: 07/07/2018 22:12    Procedures Procedures (including critical care time)  Medications Ordered in ED Medications - No data to display   Initial Impression / Assessment and Plan / ED Course  I have reviewed the triage vital signs and the nursing notes.  Pertinent labs & imaging results that were available during my care of the patient were reviewed by me and considered  in my medical decision making (see chart for details).  Clinical Course as of Jul 07 2319  Tue Jul 07, 2018  2310 Consult: Reviewed with Dr. August Luz.  Advises at this time with patient's oxygen saturation stable and at 99-100, mild tachypnea but no respiratory distress, patient should continue outpatient management.  May consider Atarax or Ativan for some anxiety and restlessness associated with symptoms.   [MP]    Clinical Course User Index [MP] Arby Barrette, MD       Patient getting follow-up CT scan to rule out PE.  He has had hospitalization and now felt increased dyspnea with some degree of  anxiety.  If CT negative for PE, patient likely stable for return home.  Dr. Elesa MassedWard to review results.  Final Clinical Impressions(s) / ED Diagnoses   Final diagnoses:  Coronavirus infection  Shortness of breath    ED Discharge Orders    None       Arby BarrettePfeiffer, Saraiya Kozma, MD 07/09/18 [phone]

## 2018-07-08 ENCOUNTER — Encounter (INDEPENDENT_AMBULATORY_CARE_PROVIDER_SITE_OTHER): Payer: Self-pay

## 2018-07-08 LAB — CULTURE, BLOOD (ROUTINE X 2)
Culture: NO GROWTH
Culture: NO GROWTH
Special Requests: ADEQUATE
Special Requests: ADEQUATE

## 2018-07-08 MED ORDER — HYDROXYZINE HCL 25 MG PO TABS: 50.0000 mg | ORAL_TABLET | Freq: Every day | ORAL | 0 refills | Status: DC

## 2018-07-08 MED ORDER — IOHEXOL 350 MG/ML SOLN
100.0000 mL | Freq: Once | INTRAVENOUS | Status: AC | PRN
  Administered 2018-07-08: 01:00:00 100 mL via INTRAVENOUS

## 2018-07-08 MED ORDER — MELATONIN 5 MG PO TABS: 5.0000 mg | ORAL_TABLET | Freq: Every evening | ORAL | 0 refills | Status: DC | PRN

## 2018-07-08 NOTE — ED Notes (Signed)
Pt given and verbalized understanding of d/c instructions and need for follow up with pcp. Told to remain in isolation until he feels better for 3 days. Told to return if s/s worsen. No further distress or questions upon discharge.

## 2018-07-08 NOTE — ED Notes (Signed)
Patient transported to CT 

## 2018-07-08 NOTE — Discharge Instructions (Addendum)
Your work-up today revealed viral pneumonia consistent with your known diagnosis of COVID-19.  You should remain in quarantine at this time and avoid others until you have been without symptoms and fever for at least 3 full days.  We are discharging you with melatonin and Atarax to use as needed for sleep at home.  You were given a dose of IV Ativan here to help you with sleep tonight.  I recommend that you obtain a pulse oximeter to use at home to check your oxygen level when you feel short of breath.  If your oxygen level is 88% or less and is persistent, please call 911.  Your CT scan did not show a blood clot in your lungs today.

## 2018-07-08 NOTE — ED Notes (Addendum)
Pt ambulated as much as possible to check oxygen saturations. SPO2 remained above 97% throughout exertion.

## 2018-07-08 NOTE — ED Notes (Signed)
Pt back from CT

## 2018-07-08 NOTE — ED Notes (Signed)
Pt resting in bed. Awaiting CT. Meds given per MAR. Pt requesting medication for home use to help him sleep. Will provide information

## 2018-07-08 NOTE — ED Provider Notes (Signed)
[facility]   CSN: [id number] Arrival date & time: 07/07/18  2025    History   Chief Complaint Chief Complaint  Patient presents with  . Shortness of Breath    HPI Javier [name] is a 48 y.o. male.     HPI Patient was admitted with coronavirus and discharged 3 days ago.  He reports that he has been taking all of his medications as prescribed.  He states that he has been careful to eat and drink adequately.  He reports he had a pretty good day day before yesterday, however he is having episodes of sometimes getting very short of breath very quickly.  He reports this evening he felt quite short of breath and had a hard time catching his breath.  No significant worsening of chest pain.  No abdominal pain or vomiting.  Reports he is having a lot of trouble sleeping.  He feels very tired. History reviewed. No pertinent past medical history. History reviewed. No pertinent past medical history.  Patient Active Problem List   Diagnosis Date Noted  . COVID-19 07/03/2018  . Sepsis (HCC) 07/03/2018  . Shortness of breath 07/03/2018  . Hyponatremia 07/03/2018  . Fever 07/03/2018  . Rhinitis, allergic 05/22/2015  . Elevated BP 05/22/2015    History reviewed. No pertinent surgical history.      Home Medications    Prior to Admission medications   Medication Sig Start Date End Date Taking? Authorizing Provider  acetaminophen (TYLENOL) 325 MG tablet Take 2 tablets (650 mg total) by mouth every 6 (six) hours as needed for mild pain or fever (or Fever >/= 101). 07/04/18  Yes Elgergawy, Leana Roeawood S, MD  albuterol (VENTOLIN HFA) 108 (90 Base) MCG/ACT inhaler Inhale 1-2 puffs into the lungs every 6 (six) hours as needed for wheezing. 07/01/18  Yes Garlon HatchetSanders, Lisa M, PA-C  vitamin C (VITAMIN C) 500 MG tablet Take 1 tablet (500 mg total) by mouth daily. Please take for 2 weeks 07/05/18  Yes Elgergawy, Leana Roeawood S, MD  zinc sulfate 220 (50 Zn) MG capsule  Take 1 capsule (220 mg total) by mouth daily. Please take for 2 days 07/05/18  Yes Elgergawy, Leana Roeawood S, MD    Family History No family history on file.  Social History Social History   Tobacco Use  . Smoking status: Never Smoker  . Smokeless tobacco: Never Used  Substance Use Topics  . Alcohol use: Yes    Comment: socially  . Drug use: Not on file     Allergies   Patient has no known allergies.   Review of Systems Review of Systems 10 Systems reviewed and are negative for acute change except as noted in the HPI.   Physical Exam Updated Vital Signs BP 119/77   Pulse 88   Temp 98.7 F (37.1 C) (Oral)   Resp (!) 25   SpO2 99%   Physical Exam  Updated Vital Signs BP 123/83   Pulse 90   Temp 98.7 F (37.1 C) (Oral)   Resp (!) 28   SpO2 99%   Physical Exam Constitutional:      Comments: Alert.  Clear mental status.  Well-nourished well-developed.  Mild tachypnea.  HENT:     Head: Normocephalic and atraumatic.  Eyes:     Extraocular Movements: Extraocular movements intact.  Cardiovascular:     Rate and Rhythm: Normal rate and regular rhythm.  Pulmonary:     Comments: Mild tachypnea.  Occasional paroxysmal cough with deep inspiration.  Lung sounds soft with occasional crackle. Abdominal:     General: There is no distension.     Palpations: Abdomen is soft.     Tenderness: There is no abdominal tenderness. There is no guarding.  Musculoskeletal: Normal range of motion.        General: No swelling or tenderness.     Right lower leg: No edema.     Left lower leg: No edema.  Skin:    General: Skin is warm and dry.  Neurological:     General: No focal deficit present.     Mental Status: He is oriented to person, place, and time.     Cranial Nerves: No cranial nerve deficit.     Coordination: Coordination normal.  Psychiatric:        Mood and Affect: Mood normal. ED Treatments / Results  Labs (all labs ordered are listed, but only abnormal results are  displayed) Labs Reviewed  CBC WITH DIFFERENTIAL/PLATELET - Abnormal; Notable for the following components:      Result Value   RBC 4.21 (*)    Platelets 525 (*)    Monocytes Absolute 1.3 (*)    Abs Immature Granulocytes 0.13 (*)    All other components within normal limits  COMPREHENSIVE METABOLIC PANEL - Abnormal; Notable for the following components:   Calcium 8.2 (*)    Albumin 2.9 (*)    All other components within normal limits  D-DIMER, QUANTITATIVE (NOT AT [facility]) - Abnormal; Notable for the following components:   D-Dimer, Quant 0.58 (*)    All other components within normal limits  LACTATE DEHYDROGENASE - Abnormal; Notable for the following components:   LDH 264 (*)    All other components within normal limits  FERRITIN - Abnormal; Notable for the following components:   Ferritin 964 (*)    All other components within normal limits  FIBRINOGEN - Abnormal; Notable for the following components:   Fibrinogen >800 (*)    All other components within normal limits  C-REACTIVE PROTEIN - Abnormal; Notable for the following components:   CRP 17.0 (*)    All other components within normal limits  CULTURE, BLOOD (ROUTINE X 2)  CULTURE, BLOOD (ROUTINE X 2)  LACTIC ACID, PLASMA  PROCALCITONIN  TRIGLYCERIDES    EKG EKG Interpretation  Date/Time:  Tuesday July 07 2018 20:41:21 EDT Ventricular Rate:  92 PR Interval:    QRS Duration: 89 QT Interval:  370 QTC Calculation: 458 R Axis:   -6 Text Interpretation:  Sinus rhythm Low voltage, precordial leads Borderline T abnormalities, inferior leads no sig change from previous Confirmed by Arby Barrette [phone]) on 07/07/2018 10:39:19 PM   Radiology Dg Chest Port 1 View  Result Date: 07/07/2018 CLINICAL DATA:  Worsening shortness of breath. COVID-19 virus infection. EXAM: PORTABLE CHEST 1 VIEW COMPARISON:  07/03/2018 FINDINGS: The heart size and mediastinal contours are within normal limits. Increased multifocal confluent  opacities are seen in the lower lung zones bilaterally compared to prior study. No evidence of pleural effusion. IMPRESSION: Increased multifocal confluent opacities in both lower lung zones. Electronically Signed   By: Myles Rosenthal M.D.   On: 07/07/2018 22:12    Procedures Procedures (including critical care time)  Medications Ordered in ED Medications  sodium chloride (PF) 0.9 % injection (has no administration in time range)  LORazepam (ATIVAN) injection 1 mg (1 mg Intravenous Given 07/07/18 2358)     Initial Impression / Assessment and Plan / ED Course  I have reviewed the triage  vital signs and the nursing notes.  Pertinent labs & imaging results that were available during my care of the patient were reviewed by me and considered in my medical decision making (see chart for details).  Clinical Course as of Jul 07 1  Tue Jul 07, 2018  2310 Consult: Reviewed with Dr. August Luz.  Advises at this time with patient's oxygen saturation stable and at 99-100, mild tachypnea but no respiratory distress, patient should continue outpatient management.  May consider Atarax or Ativan for some anxiety and restlessness associated with symptoms.   [MP]    Clinical Course User Index [MP] Arby Barrette, MD      Patient has known coronavirus.  He felt more short of breath tonight.  It came on in a quick episode and felt very anxious at the time.  He had to call EMS for transport.  Patient does not have hypoxia.  Oxygen saturation remains 99 to 100% on room air.  Patient has mild tachypnea.  Mental status is clear.  He is eating and drinking at home.  One of his main concerns is his inability to sleep.  He reports that he has been sleep deprived for a number of nights and really needs something to help him sleep.  Have discussed this with Dr. Mikeal Hawthorne but at this time patient, as far as his coronavirus is concerned can continue home management.  Will also proceed with CT scan to rule out PE and further  define degree of pneumonitis.  Anticipate patient will be likely stable for discharge pending PE results.  Dr. Elesa Massed to review results and final disposition.  Final Clinical Impressions(s) / ED Diagnoses   Final diagnoses:  Coronavirus infection  Shortness of breath    ED Discharge Orders    None       Arby Barrette, MD 07/08/18 0006

## 2018-07-09 ENCOUNTER — Encounter (INDEPENDENT_AMBULATORY_CARE_PROVIDER_SITE_OTHER): Payer: Self-pay

## 2018-07-10 ENCOUNTER — Encounter (INDEPENDENT_AMBULATORY_CARE_PROVIDER_SITE_OTHER): Payer: Self-pay

## 2018-07-10 ENCOUNTER — Ambulatory Visit: Payer: HRSA Program | Attending: Family Medicine | Admitting: Family Medicine

## 2018-07-10 ENCOUNTER — Other Ambulatory Visit: Payer: Self-pay

## 2018-07-10 ENCOUNTER — Encounter: Payer: Self-pay | Admitting: Family Medicine

## 2018-07-10 DIAGNOSIS — J189 Pneumonia, unspecified organism: Secondary | ICD-10-CM

## 2018-07-10 DIAGNOSIS — J069 Acute upper respiratory infection, unspecified: Secondary | ICD-10-CM

## 2018-07-10 DIAGNOSIS — U071 COVID-19: Secondary | ICD-10-CM

## 2018-07-10 MED ORDER — AZITHROMYCIN 500 MG PO TABS: 500.0000 mg | ORAL_TABLET | Freq: Every day | ORAL | 0 refills | Status: DC

## 2018-07-10 NOTE — Progress Notes (Signed)
Hospital Follow due to being positive for Covid 19. Per patient he started getting sick on the 22nd of April.

## 2018-07-10 NOTE — Progress Notes (Signed)
Virtual Visit via Telephone Note  I connected with Javier [name] on 07/11/18 at  2:10 PM EDT by telephone and verified that I am speaking with the correct person using two identifiers.   I discussed the limitations, risks, security and privacy concerns of performing an evaluation and management service by telephone and the availability of in person appointments. I also discussed with the patient that there may be a patient responsible charge related to this service. The patient expressed understanding and agreed to proceed.  Patient Location: Home Provider Location: Office Others participating in call: Call initiated by Guillermina [place] Richard, CMA   History of Present Illness:      48 year old male new to the practice who reports that he has had issues with shortness of breath, cough and nighttime fevers since the end of March and patient went to the emergency department on 07/01/2018 after worsening of his shortness of breath and patient states that he was diagnosed with COVID-19.  Patient states that he was later admitted to the hospital for pneumonia related to COVID-19.  Patient states that he has completed all prescribed antibiotic therapy.  Patient states that he was also told to take Tylenol, vitamin C and zinc every 6 hours which he has been doing since the time of his hospital discharge.  Patient returned to the emergency department earlier this week on 07/07/2018 due to recurrent episodes of quickly feeling as if he is short of breath.  He states that on the night that he went to the emergency department he felt as if he could not catch his breath.  Patient reports he continues to feel as if he is having a fever at night but he does not have a thermometer.  Patient does feel that his breathing is improved.  Patient denies any chest tightness or chest pain.  Patient reports that he had previously been unable to sleep at night due to the recurrent fever and recurrent cough.  Patient states that he  was given a medication at the emergency department recently to help with sleep, per ED records, prescription was given for Atarax.  Patient states that he is gradually feeling better.  Patient is not having any production of sputum with his current cough.  Patient does not have any headache.  Patient reports no significant past medical history, no prior surgeries and no family history of high blood pressure, diabetes, heart disease or stroke.  No family history of cancer.  Patient has quarantined himself away from other members of his household during his illness.  Patient has had no recent international travel but did travel to [place] for work about a week before onset of his symptoms.   Past Medical History:  Diagnosis Date  . No pertinent past medical history     Past Surgical History:  Procedure Laterality Date  . NO PAST SURGERIES      Family History  Problem Relation Age of Onset  . Hypertension Neg Hx   . Heart disease Neg Hx   . Cancer Neg Hx   . AAA (abdominal aortic aneurysm) Neg Hx     Social History   Tobacco Use  . Smoking status: Never Smoker  . Smokeless tobacco: Never Used  Substance Use Topics  . Alcohol use: Yes    Comment: socially  . Drug use: Never     No Known Allergies    Review of Systems  Constitutional: Positive for diaphoresis, fever and malaise/fatigue. Negative for chills.  HENT: Negative for congestion  and sore throat.   Eyes: Negative for blurred vision and double vision.  Respiratory: Positive for cough and shortness of breath (improved). Negative for wheezing.   Cardiovascular: Negative for chest pain and palpitations.  Gastrointestinal: Negative for abdominal pain, constipation, diarrhea, heartburn, nausea and vomiting.  Genitourinary: Negative for dysuria and frequency.  Musculoskeletal: Positive for myalgias. Negative for back pain and joint pain.  Skin: Negative for itching and rash.  Neurological: Negative for dizziness and headaches.    Endo/Heme/Allergies: Negative for polydipsia. Does not bruise/bleed easily.   Observations/Objective: No vital signs or physical exam conducted as visit was done via telephone  Assessment and Plan: 1. Acute respiratory disease due to COVID-19 virus Patient's chart reviewed including multiple emergency department visits due to shortness of breath and overnight hospitalization due to shortness of breath and suspected sepsis from COVID-19 pneumonia.  Patient also status post emergency department visit on 07/07/2018 due to intermittent episodes of shortness of breath.  Patient had CT angiogram of the chest to rule out pulmonary embolism.  He was negative for pulmonary embolism however CT showed widespread bilateral pulmonary opacity compatible of COVID-19 pneumonia.  Patient was asked to take additional antibiotic therapy and prescription was sent to his pharmacy for azithromycin 500 mg daily x3 days and he will have a family member pick up the medication.  Patient should continue his Tylenol but as he is feeling better and fevers are mostly night, he may wish to take the Tylenol every 8 hours with a dose within 30 minutes of bedtime.  Patient should continue use of his albuterol inhaler.  Patient is aware that he should seek medical attention if he has any worsening of shortness of breath, difficulty breathing, chest pain or any concerns.  Patient was asked to obtain a chest x-ray in about 2 weeks to see if there is clearing of his pneumonia.  Continue to remain well-hydrated. - DG Chest 2 View; Future - azithromycin (ZITHROMAX) 500 MG tablet; Take 1 tablet (500 mg total) by mouth daily.  Dispense: 3 tablet; Refill: 0  2. Community acquired pneumonia, bilateral Patient with recent chest CT showing bilateral pneumonia consistent with patient's COVID-19.  Patient will be provided with an additional 3 days of azithromycin 500 mg once daily.  Continue use of albuterol inhaler as needed and seek medical  attention for any increased shortness of breath or difficulty breathing.  Patient reports that his breathing actually feels improved compared to his 07/07/2018 ED visit. - DG Chest 2 View; Future - azithromycin (ZITHROMAX) 500 MG tablet; Take 1 tablet (500 mg total) by mouth daily.  Dispense: 3 tablet; Refill: 0  Follow Up Instructions:Return in about 1 week (around 07/17/2018), or if symptoms worsen or fail to improve, for COVID-19 pneumonia.    I discussed the assessment and treatment plan with the patient. The patient was provided an opportunity to ask questions and all were answered. The patient agreed with the plan and demonstrated an understanding of the instructions.   The patient was advised to call back or seek an in-person evaluation if the symptoms worsen or if the condition fails to improve as anticipated.  I provided  22 minutes of non-face-to-face time during this encounter speaking with the patient which did not include additional time spent reviewing patient's medical records and documentation of visit.   Cain Saupe, MD

## 2018-07-11 ENCOUNTER — Encounter (INDEPENDENT_AMBULATORY_CARE_PROVIDER_SITE_OTHER): Payer: Self-pay

## 2018-07-11 ENCOUNTER — Telehealth: Payer: Self-pay | Admitting: *Deleted

## 2018-07-11 NOTE — Telephone Encounter (Signed)
Spoke to told him I am calling regarding your questionnaire you answered regarding COVID -19 symptoms. Asked pt when diarrhea started and how many times have you gone? Pt said only one time first thing this morning when I woke up. Asked pt if any dizziness, nausea or headaches. Pt denied all symptoms. Told pt if continues to make sure drink plenty of fluids and follow a bland diet ex: bananas, rice, crackers, toast, broth. Avoid alcohol, spicy foods, caffeine or fatty foods that could make diarrhea worse. Continue to monitor for signs of dehydration increased thirst, decreased urine output, dark urine, headaches or dizziness. Also advised pt can try OTC medications Imodium, Kaopectate, Pepto-Bismol as per manufacturer's instructions. Pt verbalized understanding. Told pt if worsening diarrhea occurs and becomes severe 6-7 movements a day notified PCP. Asked pt if he had a PCP due to not seeing one in chart. Pt said he spoke to Dr. Cain Saupe yesterday and needs to make an appointment but does not have number. Looked up for patient, gave him information and told him to call office on Monday and schedule appointment with provider. Pt verbalized understanding.

## 2018-07-12 ENCOUNTER — Encounter (INDEPENDENT_AMBULATORY_CARE_PROVIDER_SITE_OTHER): Payer: Self-pay

## 2018-07-13 ENCOUNTER — Encounter (INDEPENDENT_AMBULATORY_CARE_PROVIDER_SITE_OTHER): Payer: Self-pay

## 2018-07-13 LAB — CULTURE, BLOOD (ROUTINE X 2)
Culture: NO GROWTH
Culture: NO GROWTH
Special Requests: ADEQUATE
Special Requests: ADEQUATE

## 2018-07-14 ENCOUNTER — Encounter (INDEPENDENT_AMBULATORY_CARE_PROVIDER_SITE_OTHER): Payer: Self-pay

## 2018-07-15 ENCOUNTER — Encounter (INDEPENDENT_AMBULATORY_CARE_PROVIDER_SITE_OTHER): Payer: Self-pay

## 2018-07-16 ENCOUNTER — Other Ambulatory Visit: Payer: Self-pay

## 2018-07-16 ENCOUNTER — Ambulatory Visit: Payer: HRSA Program | Attending: Family Medicine | Admitting: Family Medicine

## 2018-07-16 ENCOUNTER — Encounter (INDEPENDENT_AMBULATORY_CARE_PROVIDER_SITE_OTHER): Payer: Self-pay

## 2018-07-16 ENCOUNTER — Encounter: Payer: Self-pay | Admitting: Family Medicine

## 2018-07-16 DIAGNOSIS — J189 Pneumonia, unspecified organism: Secondary | ICD-10-CM

## 2018-07-16 DIAGNOSIS — U071 COVID-19: Secondary | ICD-10-CM

## 2018-07-16 DIAGNOSIS — J988 Other specified respiratory disorders: Secondary | ICD-10-CM

## 2018-07-16 NOTE — Progress Notes (Signed)
Virtual Visit via Telephone Note  I connected with Javier [name] on 07/16/18 at 10:50 AM EDT by telephone and verified that I am speaking with the correct person using two identifiers.   I discussed the limitations, risks, security and privacy concerns of performing an evaluation and management service by telephone and the availability of in person appointments. I also discussed with the patient that there may be a patient responsible charge related to this service. The patient expressed understanding and agreed to proceed.  Patient Location: Home Provider Location: Office Others participating in call: [name], RMA   History of Present Illness:      48 yo male who with COVID-19 who is contacted for follow-up as the patient had emergency department visit 07/07/18 due to shortness of breath from previously diagnosed COVID-19 and patient did have abnormal CT scan at that time showing bilateral pneumonia.  Patient states that he has taken the 3 days of azithromycin 500 mg since his last visit.  He reports that his cough has resolved.  He feels that he is no longer short of breath.  Patient has had no further fever in approximately 5 days.  He denies any nausea/vomiting/diarrhea or rash.  Patient reports that he never experienced loss of sensation of smell.  Patient did have a decreased appetite but this has now improved and he feels that he is eating normally.  Patient states that after his last visit since he did not have any return of fever, headache or body aches, he discontinued the use of Tylenol, zinc and vitamin C.  Patient would now like to start going outside in his backyard and to exercise.  He continues to drink plenty of fluids to remain hydrated.   Past Medical History:  Diagnosis Date  . No pertinent past medical history     Past Surgical History:  Procedure Laterality Date  . NO PAST SURGERIES      Family History  Problem Relation Age of Onset  . Hypertension Neg Hx    . Heart disease Neg Hx   . Cancer Neg Hx   . AAA (abdominal aortic aneurysm) Neg Hx     Social History   Tobacco Use  . Smoking status: Never Smoker  . Smokeless tobacco: Never Used  Substance Use Topics  . Alcohol use: Yes    Comment: socially  . Drug use: Never     No Known Allergies    Review of Systems  Constitutional: Positive for diaphoresis (Occasional mild). Negative for chills, fever and malaise/fatigue.  HENT: Negative for congestion and sore throat.   Respiratory: Negative for cough and shortness of breath.   Cardiovascular: Negative for chest pain and palpitations.  Gastrointestinal: Negative for abdominal pain, constipation, diarrhea, heartburn, nausea and vomiting.  Genitourinary: Negative for dysuria and frequency.  Musculoskeletal: Negative for back pain, joint pain and myalgias.  Skin: Negative for itching and rash.  Neurological: Negative for dizziness and headaches.  Endo/Heme/Allergies: Negative for polydipsia. Does not bruise/bleed easily.    Observations/Objective: No vital signs or physical exam conducted as visit was done via telephone due to restrictions and limitations on in person office visits due to the current COVID-19 pandemic  Assessment and Plan: 1. COVID-19 Patient reports that his symptoms have greatly improved and he has had no fever for the past 5 days.  Patient is encouraged to still check his temperature 2-3 times daily for the next 14 days as some people will experience a return of the fever/relapsing symptoms.  Continue to rest, remain hydrated.  Patient wishes to begin exercising.  I suggested that patient initially walk or do very light exercise in his backyard away from other people for about 5 minutes at a time and he may do this 2-3 times a day as tolerated and slowly build up tolerance.  Discussed with the patient that due to his recent illness he will take some time to feel back to 100% normal.  Also because of his pneumonia, he  should try to avoid overexertion and gradually build up exercise tolerance.  2. Pneumonia of both lungs due to infectious organism, unspecified part of lung He has been asked to obtain chest x-ray in approximately 2 weeks to make sure that infection has resolved.  Patient should call/seek medical attention if he has any return of cough/fever and go to the emergency department for any chest pain or shortness of breath/difficulty breathing.  Chest x-ray order was placed during last visit.  Follow Up Instructions:Return if symptoms worsen or fail to improve, for 2 weeks if not better or sooner.    I discussed the assessment and treatment plan with the patient. The patient was provided an opportunity to ask questions and all were answered. The patient agreed with the plan and demonstrated an understanding of the instructions.   The patient was advised to call back or seek an in-person evaluation if the symptoms worsen or if the condition fails to improve as anticipated.  I provided 14 minutes of non-face-to-face time during this encounter.   Cain Saupeammie Kourtney Montesinos, MD

## 2018-07-16 NOTE — Progress Notes (Signed)
Per pt he is getting a lot better. Per pt today is his 5th day with no fever. Cough is gone.   Per pt he's on a machine that helps him take deep breathe 10 times an hour and he uses it 10 times in 5 minutes.   Should he continue the vitamin c and zinc?

## 2018-07-17 ENCOUNTER — Encounter (INDEPENDENT_AMBULATORY_CARE_PROVIDER_SITE_OTHER): Payer: Self-pay

## 2018-07-28 ENCOUNTER — Telehealth: Payer: Self-pay | Admitting: *Deleted

## 2018-07-28 NOTE — Telephone Encounter (Signed)
Spoke with patient and informed him that the chest Xray with [facility] is a Walk in and he do not need an appt. Informed patient with the address of both locations and the time frame [facility] do the walk in for Chest Xray. Patient verbalized understanding and agreed to go to one of the locations.

## 2018-07-28 NOTE — Telephone Encounter (Signed)
-----   Message from Cain Saupe, MD sent at 07/16/2018  7:20 PM EDT ----- Regarding: follow-up CXR I believe that I have already placed the order but please contact patient and give him instructions in how to get a CXR in follow-up of his bilateral pneumonia due to COVID. I would like for him to have a CXR in about 2 weeks to make sure that his lungs have improved

## 2018-07-30 ENCOUNTER — Other Ambulatory Visit: Payer: Self-pay

## 2018-07-30 ENCOUNTER — Ambulatory Visit
Admission: RE | Admit: 2018-07-30 | Discharge: 2018-07-30 | Disposition: A | Discharge status: Home / Self Care | Payer: Self-pay | Source: Ambulatory Visit | Attending: Family Medicine | Admitting: Family Medicine

## 2018-07-30 DIAGNOSIS — J189 Pneumonia, unspecified organism: Secondary | ICD-10-CM

## 2018-07-30 DIAGNOSIS — J069 Acute upper respiratory infection, unspecified: Secondary | ICD-10-CM

## 2018-07-30 DIAGNOSIS — U071 COVID-19: Secondary | ICD-10-CM

## 2018-07-31 ENCOUNTER — Telehealth: Payer: Self-pay | Admitting: Family Medicine

## 2018-07-31 NOTE — Telephone Encounter (Signed)
Pt called in regards to his Isolation, he wants to know which steps he has to make to start going back to normal(if any), please follow up

## 2018-07-31 NOTE — Telephone Encounter (Signed)
Called patient and informed him with what provider stated and he verbalized understanding and agreed with advise.

## 2018-07-31 NOTE — Telephone Encounter (Signed)
He just needs to follow the state and CDC guidelines regarding self-distancing and I would recommend that he wear a mask in public so that he does not contract anything from anyone else

## 2018-08-06 ENCOUNTER — Telehealth: Payer: Self-pay | Admitting: *Deleted

## 2018-08-06 NOTE — Telephone Encounter (Signed)
I called pt.   I asked if he would be interested in donating his plasma to assist in the recovery of COVID-19 pts.   Since he is recovered from the COVID-19 he has antibodies in his plasma that would hopefully be beneficial to someone recovering from the virus. He was interested in donating so I gave him the name of the web site [employer] and instructions on what to do.   I let him know after the application was submitted on line he would be contacted to set up the donation process.  I thanked for being willing to make a donation.   He said he wanted to discuss it with his wife before filling out the application   I let him know he could fill out the application whenever he was ready after talking it over with his wife.

## 2019-04-05 ENCOUNTER — Ambulatory Visit: Payer: 59 | Admitting: Registered Nurse

## 2019-04-05 ENCOUNTER — Other Ambulatory Visit: Payer: Self-pay

## 2019-04-05 ENCOUNTER — Encounter: Payer: Self-pay | Admitting: Registered Nurse

## 2019-04-05 VITALS — BP 127/77 | HR 84 | Temp 97.0°F | Ht 66.0 in | Wt 157.2 lb

## 2019-04-05 DIAGNOSIS — Z13228 Encounter for screening for other metabolic disorders: Secondary | ICD-10-CM | POA: Diagnosis not present

## 2019-04-05 DIAGNOSIS — E119 Type 2 diabetes mellitus without complications: Secondary | ICD-10-CM

## 2019-04-05 DIAGNOSIS — Z13220 Encounter for screening for lipoid disorders: Secondary | ICD-10-CM

## 2019-04-05 DIAGNOSIS — Z7689 Persons encountering health services in other specified circumstances: Secondary | ICD-10-CM | POA: Diagnosis not present

## 2019-04-05 DIAGNOSIS — Z130 Encounter for screening for diseases of the blood and blood-forming organs and certain disorders involving the immune mechanism: Secondary | ICD-10-CM | POA: Diagnosis not present

## 2019-04-05 DIAGNOSIS — Z1329 Encounter for screening for other suspected endocrine disorder: Secondary | ICD-10-CM | POA: Diagnosis not present

## 2019-04-05 LAB — POCT GLYCOSYLATED HEMOGLOBIN (HGB A1C): Hemoglobin A1C: 6.5 % — AB (ref 4.0–5.6)

## 2019-04-05 NOTE — Progress Notes (Signed)
New Patient Office Visit  Subjective:  Patient ID: Javier [name], male    DOB: Aug 21, 1970  Age: 49 y.o. MRN: [id number]  CC:  Chief Complaint  Patient presents with  . Transitions Of Care    follow up and also constipation for the last month but hes taking a stool softener from [facility].    HPI Javier [name] presents for visit to establish care.  Recently doing work in [employer] and had frequent urination - went to ED, had sugars around 600, however, no symptoms of DKA. Noted constipation that had started occurring as well. Was given dx of T2DM, 1 mo metformin 500mg  PO bid. Does not know his A1c or if it was collected at that visit.  Since, has undertaken dramatic positive lifestyle changes - no longer drinks EtOH, limits processed foods, no sweets, walks 6-7 miles every other day, very conscious of his diet. He states he is feeling much better, sleeping much better, and is very invested in his health.  No further concerns today.  Past Medical History:  Diagnosis Date  . Diabetes mellitus without complication (HCC)   . No pertinent past medical history     Past Surgical History:  Procedure Laterality Date  . NO PAST SURGERIES      Family History  Problem Relation Age of Onset  . Hypertension Neg Hx   . Heart disease Neg Hx   . Cancer Neg Hx   . AAA (abdominal aortic aneurysm) Neg Hx     Social History   Socioeconomic History  . Marital status: Married    Spouse name: Not on file  . Number of children: 3  . Years of education: Not on file  . Highest education level: Not on file  Occupational History  . Not on file  Tobacco Use  . Smoking status: Never Smoker  . Smokeless tobacco: Never Used  Substance and Sexual Activity  . Alcohol use: Yes    Comment: socially  . Drug use: Never  . Sexual activity: Not on file  Other Topics Concern  . Not on file  Social History Narrative   Married with 3 children   Works   Social Determinants of  Health   Financial Resource Strain:   . Difficulty of Paying Living Expenses: Not on file  Food Insecurity:   . Worried About Financial trader in the Last Year: Not on file  . Ran Out of Food in the Last Year: Not on file  Transportation Needs:   . Lack of Transportation (Medical): Not on file  . Lack of Transportation (Non-Medical): Not on file  Physical Activity:   . Days of Exercise per Week: Not on file  . Minutes of Exercise per Session: Not on file  Stress:   . Feeling of Stress : Not on file  Social Connections:   . Frequency of Communication with Friends and Family: Not on file  . Frequency of Social Gatherings with Friends and Family: Not on file  . Attends Religious Services: Not on file  . Active Member of Clubs or Organizations: Not on file  . Attends Programme researcher, broadcasting/film/video Meetings: Not on file  . Marital Status: Not on file  Intimate Partner Violence:   . Fear of Current or Ex-Partner: Not on file  . Emotionally Abused: Not on file  . Physically Abused: Not on file  . Sexually Abused: Not on file    ROS Review of Systems  Constitutional: Negative.  HENT: Negative.   Eyes: Negative.   Respiratory: Negative.   Cardiovascular: Negative.   Gastrointestinal: Negative.   Endocrine: Negative.   Genitourinary: Negative.   Musculoskeletal: Negative.   Skin: Negative.   Allergic/Immunologic: Negative.   Neurological: Negative.   Hematological: Negative.   Psychiatric/Behavioral: Negative.   All other systems reviewed and are negative.   Objective:   Today's Vitals: BP 127/77   Pulse 84   Temp (!) 97 F (36.1 C) (Temporal)   Ht 5\' 6"  (1.676 m)   Wt 157 lb 3.2 oz (71.3 kg)   SpO2 99%   BMI 25.37 kg/m   Physical Exam Constitutional:      General: He is not in acute distress.    Appearance: Normal appearance. He is normal weight. He is not ill-appearing, toxic-appearing or diaphoretic.  HENT:     Head: Normocephalic and atraumatic.  Cardiovascular:       Rate and Rhythm: Normal rate and regular rhythm.  Pulmonary:     Effort: Pulmonary effort is normal. No respiratory distress.  Neurological:     General: No focal deficit present.     Mental Status: He is alert and oriented to person, place, and time. Mental status is at baseline.     Cranial Nerves: No cranial nerve deficit.  Psychiatric:        Mood and Affect: Mood normal.        Behavior: Behavior normal.        Thought Content: Thought content normal.        Judgment: Judgment normal.     Assessment & Plan:   Problem List Items Addressed This Visit      Endocrine   Type 2 diabetes mellitus without complication, without long-term current use of insulin (HCC) - Primary   Relevant Medications   metFORMIN (GLUCOPHAGE) 500 MG tablet   Other Relevant Orders   POCT glycosylated hemoglobin (Hb A1C) (Completed)   Comprehensive metabolic panel   Ambulatory referral to Ophthalmology    Other Visit Diagnoses    Encounter to establish care       Screening for endocrine, metabolic and immunity disorder       Relevant Orders   POCT glycosylated hemoglobin (Hb A1C) (Completed)   Comprehensive metabolic panel   CBC   TSH   Lipid screening       Relevant Orders   Lipid Panel      Outpatient Encounter Medications as of 04/05/2019  Medication Sig  . acetaminophen (TYLENOL) 325 MG tablet Take 2 tablets (650 mg total) by mouth every 6 (six) hours as needed for mild pain or fever (or Fever >/= 101).  . zinc sulfate 220 (50 Zn) MG capsule Take 1 capsule (220 mg total) by mouth daily. Please take for 2 days  . albuterol (VENTOLIN HFA) 108 (90 Base) MCG/ACT inhaler Inhale 1-2 puffs into the lungs every 6 (six) hours as needed for wheezing. (Patient not taking: Reported on 04/05/2019)  . azithromycin (ZITHROMAX) 500 MG tablet Take 1 tablet (500 mg total) by mouth daily. (Patient not taking: Reported on 04/05/2019)  . hydrOXYzine (ATARAX/VISTARIL) 25 MG tablet Take 2 tablets (50 mg total)  by mouth at bedtime. As needed for sleep (Patient not taking: Reported on 04/05/2019)  . Melatonin 5 MG TABS Take 1 tablet (5 mg total) by mouth at bedtime as needed. (Patient not taking: Reported on 04/05/2019)  . metFORMIN (GLUCOPHAGE) 500 MG tablet Take 500 mg by mouth 2 (two) times daily with a meal.  .  vitamin C (VITAMIN C) 500 MG tablet Take 1 tablet (500 mg total) by mouth daily. Please take for 2 weeks (Patient not taking: Reported on 04/05/2019)   No facility-administered encounter medications on file as of 04/05/2019.    Follow-up: Return in about 3 months (around 07/04/2019) for t2dm follow up w provider.   PLAN   A1c today 6.5. Effective diabetes management. Based on having a sugar of 600 when he was seen in ED, we can assume it had been higher.  Continue lifestyle modifications. Will withhold metformin at this time  Recheck in 3 mos  Educated on diet and exercise routines for ideal health  Labs drawn, will follow up as warranted  Patient encouraged to call clinic with any questions, comments, or concerns.  Janeece Agee, NP

## 2019-04-05 NOTE — Patient Instructions (Addendum)
Mr. Javier [name] to meet you today  Your A1c is at 6.5 today - this is the minimal level diagnostic for diabetes and what I'd consider very well controlled diabetes. The normal range for A1c is between 4.8 and 5.7 - Let's see if we can't get you there by your next visit in 3 months.  I'll be placing a referral to an eye doctor for you to see - they will call you within 1-2 weeks.  I've attached some information about diabetes - but it seems you already know most of it! Keep up the great work and I'll see you soon.  If you have any questions or concerns, please feel free to return sooner than 3 months if needed.    If you have lab work done today you will be contacted with your lab results within the next 2 weeks.  If you have not heard from [place] then please contact us. The fastest way to get your results is to register for My Chart.   IF you received an x-ray today, you will receive an invoice from [facility]. Please contact [facility] at [phone] with questions or concerns regarding your invoice.   IF you received labwork today, you will receive an invoice from [facility]. Please contact [facility] at [phone] with questions or concerns regarding your invoice.   Our billing staff will not be able to assist you with questions regarding bills from these companies.  You will be contacted with the lab results as soon as they are available. The fastest way to get your results is to activate your My Chart account. Instructions are located on the last page of this paperwork. If you have not heard from [place] regarding the results in 2 weeks, please contact this office.     Carbohydrate Counting for Diabetes Mellitus, Adult  Carbohydrate counting is a method of keeping track of how many carbohydrates you eat. Eating carbohydrates naturally increases the amount of sugar (glucose) in the blood. Counting how many carbohydrates you eat helps keep your blood glucose within  normal limits, which helps you manage your diabetes (diabetes mellitus). It is important to know how many carbohydrates you can safely have in each meal. This is different for every person. A diet and nutrition specialist (registered dietitian) can help you make a meal plan and calculate how many carbohydrates you should have at each meal and snack. Carbohydrates are found in the following foods:  Grains, such as breads and cereals.  Dried beans and soy products.  Starchy vegetables, such as potatoes, peas, and corn.  Fruit and fruit juices.  Milk and yogurt.  Sweets and snack foods, such as cake, cookies, candy, chips, and soft drinks. How do I count carbohydrates? There are two ways to count carbohydrates in food. You can use either of the methods or a combination of both. Reading "Nutrition Facts" on packaged food The "Nutrition Facts" list is included on the labels of almost all packaged foods and beverages in the [place] It includes:  The serving size.  Information about nutrients in each serving, including the grams (g) of carbohydrate per serving. To use the "Nutrition Facts":  Decide how many servings you will have.  Multiply the number of servings by the number of carbohydrates per serving.  The resulting number is the total amount of carbohydrates that you will be having. Learning standard serving sizes of other foods When you eat carbohydrate foods that are not packaged or do not include "Nutrition Facts"  on the label, you need to measure the servings in order to count the amount of carbohydrates:  Measure the foods that you will eat with a food scale or measuring cup, if needed.  Decide how many standard-size servings you will eat.  Multiply the number of servings by 15. Most carbohydrate-rich foods have about 15 g of carbohydrates per serving. ? For example, if you eat 8 oz (170 g) of strawberries, you will have eaten 2 servings and 30 g of carbohydrates (2 servings x  15 g = 30 g).  For foods that have more than one food mixed, such as soups and casseroles, you must count the carbohydrates in each food that is included. The following list contains standard serving sizes of common carbohydrate-rich foods. Each of these servings has about 15 g of carbohydrates:   hamburger bun or  English muffin.   oz (15 mL) syrup.   oz (14 g) jelly.  1 slice of bread.  1 six-inch tortilla.  3 oz (85 g) cooked rice or pasta.  4 oz (113 g) cooked dried beans.  4 oz (113 g) starchy vegetable, such as peas, corn, or potatoes.  4 oz (113 g) hot cereal.  4 oz (113 g) mashed potatoes or  of a large baked potato.  4 oz (113 g) canned or frozen fruit.  4 oz (120 mL) fruit juice.  4-6 crackers.  6 chicken nuggets.  6 oz (170 g) unsweetened dry cereal.  6 oz (170 g) plain fat-free yogurt or yogurt sweetened with artificial sweeteners.  8 oz (240 mL) milk.  8 oz (170 g) fresh fruit or one small piece of fruit.  24 oz (680 g) popped popcorn. Example of carbohydrate counting Sample meal  3 oz (85 g) chicken breast.  6 oz (170 g) brown rice.  4 oz (113 g) corn.  8 oz (240 mL) milk.  8 oz (170 g) strawberries with sugar-free whipped topping. Carbohydrate calculation 1. Identify the foods that contain carbohydrates: ? Rice. ? Corn. ? Milk. ? Strawberries. 2. Calculate how many servings you have of each food: ? 2 servings rice. ? 1 serving corn. ? 1 serving milk. ? 1 serving strawberries. 3. Multiply each number of servings by 15 g: ? 2 servings rice x 15 g = 30 g. ? 1 serving corn x 15 g = 15 g. ? 1 serving milk x 15 g = 15 g. ? 1 serving strawberries x 15 g = 15 g. 4. Add together all of the amounts to find the total grams of carbohydrates eaten: ? 30 g + 15 g + 15 g + 15 g = 75 g of carbohydrates total. Summary  Carbohydrate counting is a method of keeping track of how many carbohydrates you eat.  Eating carbohydrates naturally  increases the amount of sugar (glucose) in the blood.  Counting how many carbohydrates you eat helps keep your blood glucose within normal limits, which helps you manage your diabetes.  A diet and nutrition specialist (registered dietitian) can help you make a meal plan and calculate how many carbohydrates you should have at each meal and snack. This information is not intended to replace advice given to you by your health care provider. Make sure you discuss any questions you have with your health care provider. Document Revised: 09/19/2016 Document Reviewed: 08/09/2015 Elsevier Patient Education  2020 [place].  Diabetes Basics  Diabetes (diabetes mellitus) is a long-term (chronic) disease. It occurs when the body does not properly  use sugar (glucose) that is released from food after you eat. Diabetes may be caused by one or both of these problems:  Your pancreas does not make enough of a hormone called insulin.  Your body does not react in a normal way to insulin that it makes. Insulin lets sugars (glucose) go into cells in your body. This gives you energy. If you have diabetes, sugars cannot get into cells. This causes high blood sugar (hyperglycemia). Follow these instructions at home: How is diabetes treated? You may need to take insulin or other diabetes medicines daily to keep your blood sugar in balance. Take your diabetes medicines every day as told by your doctor. List your diabetes medicines here: Diabetes medicines  Name of medicine: ______________________________ ? Amount (dose): _______________ Time (a.m./p.m.): _______________ Notes: ___________________________________  Name of medicine: ______________________________ ? Amount (dose): _______________ Time (a.m./p.m.): _______________ Notes: ___________________________________  Name of medicine: ______________________________ ? Amount (dose): _______________ Time (a.m./p.m.): _______________ Notes:  ___________________________________ If you use insulin, you will learn how to give yourself insulin by injection. You may need to adjust the amount based on the food that you eat. List the types of insulin you use here: Insulin  Insulin type: ______________________________ ? Amount (dose): _______________ Time (a.m./p.m.): _______________ Notes: ___________________________________  Insulin type: ______________________________ ? Amount (dose): _______________ Time (a.m./p.m.): _______________ Notes: ___________________________________  Insulin type: ______________________________ ? Amount (dose): _______________ Time (a.m./p.m.): _______________ Notes: ___________________________________  Insulin type: ______________________________ ? Amount (dose): _______________ Time (a.m./p.m.): _______________ Notes: ___________________________________  Insulin type: ______________________________ ? Amount (dose): _______________ Time (a.m./p.m.): _______________ Notes: ___________________________________ How do I manage my blood sugar?  Check your blood sugar levels using a blood glucose monitor as directed by your doctor. Your doctor will set treatment goals for you. Generally, you should have these blood sugar levels:  Before meals (preprandial): 80-130 mg/dL (4.4-7.2 mmol/L).  After meals (postprandial): below 180 mg/dL (10 mmol/L).  A1c level: less than 7%. Write down the times that you will check your blood sugar levels: Blood sugar checks  Time: _______________ Notes: ___________________________________  Time: _______________ Notes: ___________________________________  Time: _______________ Notes: ___________________________________  Time: _______________ Notes: ___________________________________  Time: _______________ Notes: ___________________________________  Time: _______________ Notes: ___________________________________  What do I need to know about low blood sugar? Low  blood sugar is called hypoglycemia. This is when blood sugar is at or below 70 mg/dL (3.9 mmol/L). Symptoms may include:  Feeling: ? Hungry. ? Worried or nervous (anxious). ? Sweaty and clammy. ? Confused. ? Dizzy. ? Sleepy. ? Sick to your stomach (nauseous).  Having: ? A fast heartbeat. ? A headache. ? A change in your vision. ? Tingling or no feeling (numbness) around the mouth, lips, or tongue. ? Jerky movements that you cannot control (seizure).  Having trouble with: ? Moving (coordination). ? Sleeping. ? Passing out (fainting). ? Getting upset easily (irritability). Treating low blood sugar To treat low blood sugar, eat or drink something sugary right away. If you can think clearly and swallow safely, follow the 15:15 rule:  Take 15 grams of a fast-acting carb (carbohydrate). Talk with your doctor about how much you should take.  Some fast-acting carbs are: ? Sugar tablets (glucose pills). Take 3-4 glucose pills. ? 6-8 pieces of hard candy. ? 4-6 oz (120-150 mL) of fruit juice. ? 4-6 oz (120-150 mL) of regular (not diet) soda. ? 1 Tbsp (15 mL) honey or sugar.  Check your blood sugar 15 minutes after you take the carb.  If your blood sugar is still at  or below 70 mg/dL (3.9 mmol/L), take 15 grams of a carb again.  If your blood sugar does not go above 70 mg/dL (3.9 mmol/L) after 3 tries, get help right away.  After your blood sugar goes back to normal, eat a meal or a snack within 1 hour. Treating very low blood sugar If your blood sugar is at or below 54 mg/dL (3 mmol/L), you have very low blood sugar (severe hypoglycemia). This is an emergency. Do not wait to see if the symptoms will go away. Get medical help right away. Call your local emergency services (911 in the [place] Do not drive yourself to the hospital. Questions to ask your health care provider  Do I need to meet with a diabetes educator?  What equipment will I need to care for myself at home?  What  diabetes medicines do I need? When should I take them?  How often do I need to check my blood sugar?  What number can I call if I have questions?  When is my next doctor's visit?  Where can I find a support group for people with diabetes? Where to find more information  American Diabetes Association: www.diabetes.org  American Association of Diabetes Educators: www.diabeteseducator.org/patient-resources Contact a doctor if:  Your blood sugar is at or above 240 mg/dL (13.3 mmol/L) for 2 days in a row.  You have been sick or have had a fever for 2 days or more, and you are not getting better.  You have any of these problems for more than 6 hours: ? You cannot eat or drink. ? You feel sick to your stomach (nauseous). ? You throw up (vomit). ? You have watery poop (diarrhea). Get help right away if:  Your blood sugar is lower than 54 mg/dL (3 mmol/L).  You get confused.  You have trouble: ? Thinking clearly. ? Breathing. Summary  Diabetes (diabetes mellitus) is a long-term (chronic) disease. It occurs when the body does not properly use sugar (glucose) that is released from food after digestion.  Take insulin and diabetes medicines as told.  Check your blood sugar every day, as often as told.  Keep all follow-up visits as told by your doctor. This is important. This information is not intended to replace advice given to you by your health care provider. Make sure you discuss any questions you have with your health care provider. Document Revised: 11/18/2018 Document Reviewed: 05/30/2017 Elsevier Patient Education  [place].  Diabetes Mellitus and Exercise Exercising regularly is important for your overall health, especially when you have diabetes (diabetes mellitus). Exercising is not only about losing weight. It has many other health benefits, such as increasing muscle strength and bone density and reducing body fat and stress. This leads to improved fitness,  flexibility, and endurance, all of which result in better overall health. Exercise has additional benefits for people with diabetes, including:  Reducing appetite.  Helping to lower and control blood glucose.  Lowering blood pressure.  Helping to control amounts of fatty substances (lipids) in the blood, such as cholesterol and triglycerides.  Helping the body to respond better to insulin (improving insulin sensitivity).  Reducing how much insulin the body needs.  Decreasing the risk for heart disease by: ? Lowering cholesterol and triglyceride levels. ? Increasing the levels of good cholesterol. ? Lowering blood glucose levels. What is my activity plan? Your health care provider or certified diabetes educator can help you make a plan for the type and frequency of exercise (activity  plan) that works for you. Make sure that you:  Do at least 150 minutes of moderate-intensity or vigorous-intensity exercise each week. This could be brisk walking, biking, or water aerobics. ? Do stretching and strength exercises, such as yoga or weightlifting, at least 2 times a week. ? Spread out your activity over at least 3 days of the week.  Get some form of physical activity every day. ? Do not go more than 2 days in a row without some kind of physical activity. ? Avoid being inactive for more than 30 minutes at a time. Take frequent breaks to walk or stretch.  Choose a type of exercise or activity that you enjoy, and set realistic goals.  Start slowly, and gradually increase the intensity of your exercise over time. What do I need to know about managing my diabetes?   Check your blood glucose before and after exercising. ? If your blood glucose is 240 mg/dL (13.3 mmol/L) or higher before you exercise, check your urine for ketones. If you have ketones in your urine, do not exercise until your blood glucose returns to normal. ? If your blood glucose is 100 mg/dL (5.6 mmol/L) or lower, eat a  snack containing 15-20 grams of carbohydrate. Check your blood glucose 15 minutes after the snack to make sure that your level is above 100 mg/dL (5.6 mmol/L) before you start your exercise.  Know the symptoms of low blood glucose (hypoglycemia) and how to treat it. Your risk for hypoglycemia increases during and after exercise. Common symptoms of hypoglycemia can include: ? Hunger. ? Anxiety. ? Sweating and feeling clammy. ? Confusion. ? Dizziness or feeling light-headed. ? Increased heart rate or palpitations. ? Blurry vision. ? Tingling or numbness around the mouth, lips, or tongue. ? Tremors or shakes. ? Irritability.  Keep a rapid-acting carbohydrate snack available before, during, and after exercise to help prevent or treat hypoglycemia.  Avoid injecting insulin into areas of the body that are going to be exercised. For example, avoid injecting insulin into: ? The arms, when playing tennis. ? The legs, when jogging.  Keep records of your exercise habits. Doing this can help you and your health care provider adjust your diabetes management plan as needed. Write down: ? Food that you eat before and after you exercise. ? Blood glucose levels before and after you exercise. ? The type and amount of exercise you have done. ? When your insulin is expected to peak, if you use insulin. Avoid exercising at times when your insulin is peaking.  When you start a new exercise or activity, work with your health care provider to make sure the activity is safe for you, and to adjust your insulin, medicines, or food intake as needed.  Drink plenty of water while you exercise to prevent dehydration or heat stroke. Drink enough fluid to keep your urine clear or pale yellow. Summary  Exercising regularly is important for your overall health, especially when you have diabetes (diabetes mellitus).  Exercising has many health benefits, such as increasing muscle strength and bone density and reducing  body fat and stress.  Your health care provider or certified diabetes educator can help you make a plan for the type and frequency of exercise (activity plan) that works for you.  When you start a new exercise or activity, work with your health care provider to make sure the activity is safe for you, and to adjust your insulin, medicines, or food intake as needed. This information is not intended  to replace advice given to you by your health care provider. Make sure you discuss any questions you have with your health care provider. Document Revised: 09/19/2016 Document Reviewed: 08/07/2015 Elsevier Patient Education  [place].  Diabetes Mellitus and Nutrition, Adult When you have diabetes (diabetes mellitus), it is very important to have healthy eating habits because your blood sugar (glucose) levels are greatly affected by what you eat and drink. Eating healthy foods in the appropriate amounts, at about the same times every day, can help you:  Control your blood glucose.  Lower your risk of heart disease.  Improve your blood pressure.  Reach or maintain a healthy weight. Every person with diabetes is different, and each person has different needs for a meal plan. Your health care provider may recommend that you work with a diet and nutrition specialist (dietitian) to make a meal plan that is best for you. Your meal plan may vary depending on factors such as:  The calories you need.  The medicines you take.  Your weight.  Your blood glucose, blood pressure, and cholesterol levels.  Your activity level.  Other health conditions you have, such as heart or kidney disease. How do carbohydrates affect me? Carbohydrates, also called carbs, affect your blood glucose level more than any other type of food. Eating carbs naturally raises the amount of glucose in your blood. Carb counting is a method for keeping track of how many carbs you eat. Counting carbs is important to keep your  blood glucose at a healthy level, especially if you use insulin or take certain oral diabetes medicines. It is important to know how many carbs you can safely have in each meal. This is different for every person. Your dietitian can help you calculate how many carbs you should have at each meal and for each snack. Foods that contain carbs include:  Bread, cereal, rice, pasta, and crackers.  Potatoes and corn.  Peas, beans, and lentils.  Milk and yogurt.  Fruit and juice.  Desserts, such as cakes, cookies, ice cream, and candy. How does alcohol affect me? Alcohol can cause a sudden decrease in blood glucose (hypoglycemia), especially if you use insulin or take certain oral diabetes medicines. Hypoglycemia can be a life-threatening condition. Symptoms of hypoglycemia (sleepiness, dizziness, and confusion) are similar to symptoms of having too much alcohol. If your health care provider says that alcohol is safe for you, follow these guidelines:  Limit alcohol intake to no more than 1 drink per day for nonpregnant women and 2 drinks per day for men. One drink equals 12 oz of beer, 5 oz of wine, or 1 oz of hard liquor.  Do not drink on an empty stomach.  Keep yourself hydrated with water, diet soda, or unsweetened iced tea.  Keep in mind that regular soda, juice, and other mixers may contain a lot of sugar and must be counted as carbs. What are tips for following this plan?  Reading food labels  Start by checking the serving size on the "Nutrition Facts" label of packaged foods and drinks. The amount of calories, carbs, fats, and other nutrients listed on the label is based on one serving of the item. Many items contain more than one serving per package.  Check the total grams (g) of carbs in one serving. You can calculate the number of servings of carbs in one serving by dividing the total carbs by 15. For example, if a food has 30 g of total carbs, it would  be equal to 2 servings of  carbs.  Check the number of grams (g) of saturated and trans fats in one serving. Choose foods that have low or no amount of these fats.  Check the number of milligrams (mg) of salt (sodium) in one serving. Most people should limit total sodium intake to less than 2,300 mg per day.  Always check the nutrition information of foods labeled as "low-fat" or "nonfat". These foods may be higher in added sugar or refined carbs and should be avoided.  Talk to your dietitian to identify your daily goals for nutrients listed on the label. Shopping  Avoid buying canned, premade, or processed foods. These foods tend to be high in fat, sodium, and added sugar.  Shop around the outside edge of the grocery store. This includes fresh fruits and vegetables, bulk grains, fresh meats, and fresh dairy. Cooking  Use low-heat cooking methods, such as baking, instead of high-heat cooking methods like deep frying.  Cook using healthy oils, such as olive, canola, or sunflower oil.  Avoid cooking with butter, cream, or high-fat meats. Meal planning  Eat meals and snacks regularly, preferably at the same times every day. Avoid going long periods of time without eating.  Eat foods high in fiber, such as fresh fruits, vegetables, beans, and whole grains. Talk to your dietitian about how many servings of carbs you can eat at each meal.  Eat 4-6 ounces (oz) of lean protein each day, such as lean meat, chicken, fish, eggs, or tofu. One oz of lean protein is equal to: ? 1 oz of meat, chicken, or fish. ? 1 egg. ?  cup of tofu.  Eat some foods each day that contain healthy fats, such as avocado, nuts, seeds, and fish. Lifestyle  Check your blood glucose regularly.  Exercise regularly as told by your health care provider. This may include: ? 150 minutes of moderate-intensity or vigorous-intensity exercise each week. This could be brisk walking, biking, or water aerobics. ? Stretching and doing strength  exercises, such as yoga or weightlifting, at least 2 times a week.  Take medicines as told by your health care provider.  Do not use any products that contain nicotine or tobacco, such as cigarettes and e-cigarettes. If you need help quitting, ask your health care provider.  Work with a Social worker or diabetes educator to identify strategies to manage stress and any emotional and social challenges. Questions to ask a health care provider  Do I need to meet with a diabetes educator?  Do I need to meet with a dietitian?  What number can I call if I have questions?  When are the best times to check my blood glucose? Where to find more information:  American Diabetes Association: diabetes.org  Academy of Nutrition and Dietetics: www.eatright.[employer] of Diabetes and Digestive and Kidney Diseases (NIH): [employer] Summary  A healthy meal plan will help you control your blood glucose and maintain a healthy lifestyle.  Working with a diet and nutrition specialist (dietitian) can help you make a meal plan that is best for you.  Keep in mind that carbohydrates (carbs) and alcohol have immediate effects on your blood glucose levels. It is important to count carbs and to use alcohol carefully. This information is not intended to replace advice given to you by your health care provider. Make sure you discuss any questions you have with your health care provider. Document Revised: 02/07/2017 Document Reviewed: 04/01/2016 Elsevier Patient Education  2020 [employer].  Diabetes Mellitus and Skin Care Diabetes (diabetes mellitus) can lead to health problems over time, including skin problems. People with diabetes have a higher risk for many types of skin complications. This is because having poorly controlled blood sugar (glucose) levels can:  Damage nerves and blood vessels. This can result in decreased feeling in your legs and feet, which means you may not notice minor  skin injuries that could lead to serious problems.  Reduce blood flow (circulation), which makes wounds heal more slowly and increases your risk of infection.  Cause areas of skin to become thick or discolored. What are some common skin conditions that affect people with diabetes? Diabetes often causes dry skin. It can also cause the skin on the feet to get thinner, break more easily, and heal more slowly. There are certain skin conditions that commonly affect people who have diabetes, such as:  Bacterial skin infections, such as styes, boils, infected hair follicles, and infections of the skin around the nails.  Fungal skin infections. These are most common in areas where skin rubs together, such as in the armpits or under the breasts.  Open sores, especially on the feet.  Tissue death (gangrene). This can happen on your feet if a serious infection does not heal properly. Gangrene can cause the need for a foot or leg to be surgically removed (amputated). Diabetes can also cause the skin to change. You may develop:  Dark, velvety markings on the skin that usually appear on the face, neck, armpits, inner thighs, and groin (acanthosis nigricans). This typically affects people of African-American and American-Indian descent.  Red, raised, scar-like tissue that may itch, feel painful, or develop into a wound (necrobiosis lipoidica).  Blisters on feet, toes, hands, or fingers.  Thickened, wax-like areas of skin that usually occur on the hands, forehead, or toes (digital sclerosis).  Brown or red ring-shaped or half-ring-shaped patches of skin on the ears or fingers (disseminated granuloma).  Pea-shaped yellow bumps that may be itchy and surrounded by a red ring (eruptive xanthomatosis). This usually affects the arms, feet, buttocks, and the top of the hands.  Round, discolored patches of tan skin that do not hurt or itch (diabetic dermopathy). These may look like age spots. What do I need to  know about itchy skin? It is common for people with diabetes to have itchy skin caused by dryness. Frequent high blood glucose levels can cause itchiness, and poor circulation and certain skin infections can make dry, itchy skin worse. If you have itchy skin that is red or covered in a rash, this could be a sign of an allergic reaction to a medicine. If you have a rash or if your skin is very itchy, contact your health care provider. You may need help to manage your diabetes better, or you may need treatment for an infection. How can I prevent skin breakdown? When you have diabetes and you get a badly infected ulcer or sore that does not heal, your skin can break down, especially if you have poor circulation or are on bed rest. To prevent skin breakdown:  Keep your skin clean and dry. Wash your skin often. Do not use hot water.  Do not use any products that contain nicotine or tobacco, such as cigarettes and e-cigarettes. Smoking affects the body's ability to heal. If you need help quitting, ask your health care provider.  Check your skin every day for cuts, bruises, redness, blisters, or sores, especially on your feet. Tell your health care  provider about any cuts, wounds, or sores you have, especially if they are healing slowly.  If you are on bed rest, try to change positions often. What else do I need to know about taking care of my skin?   To relieve dry skin and itching: ? Limit baths and showers to 5-10 minutes. ? Bathe with lukewarm water instead of hot water. ? Use mild soap and gentle skin cleansers. Do not use soap that is perfumed or harsh or dries your skin. ? Put on lotion as soon as you finish bathing.  Make sure that your health care provider performs a visual foot exam at every medical visit.  Schedule a foot exam with your health care provider once every year. This exam includes an inspection of the structure and skin of your feet.  If you get a skin injury, such as a cut,  blister, or sore, check the area every day for signs of infection. Check for: ? More redness, swelling, or pain. ? More fluid or blood. ? Warmth. ? Pus or a bad smell. Contact a health care provider if:  You develop a cut or sore, especially on your feet.  You develop signs of infection after a skin injury.  Your blood glucose level is higher than 240 mg/dL (13.3 mmol/L) for 2 days in a row.  You have itchy skin that develops redness or a rash.  You have discolored areas of skin.  You have areas where your skin is changing, such as thickening or appearing shiny. Summary  Diabetes (diabetes mellitus) can lead to health problems over time, including skin problems.  People with diabetes have a higher risk for many types of skin complications.  Check your skin every day for cuts, bruises, redness, blisters, or sores, especially on your feet.  Tell your health care provider about any cuts, wounds, or sores you have, especially if they are healing slowly. This information is not intended to replace advice given to you by your health care provider. Make sure you discuss any questions you have with your health care provider. Document Revised: 09/19/2016 Document Reviewed: 08/08/2015 Elsevier Patient Education  [place].  Gastroparesis  Gastroparesis is a condition in which food takes longer than normal to empty from the stomach. The condition is usually long-lasting (chronic). It may also be called delayed gastric emptying. There is no cure, but there are treatments and things that you can do at home to help relieve symptoms. Treating the underlying condition that causes gastroparesis can also help relieve symptoms. What are the causes? In many cases, the cause of this condition is not known. Possible causes include:  A hormone (endocrine) disorder, such as hypothyroidism or diabetes.  A nervous system disease, such as Parkinson's disease or multiple sclerosis.  Cancer,  infection, or surgery that affects the stomach or vagus nerve. The vagus nerve runs from your chest, through your neck, to the lower part of your brain.  A connective tissue disorder, such as scleroderma.  Certain medicines. What increases the risk? You are more likely to develop this condition if you:  Have certain disorders or diseases, including: ? An endocrine disorder. ? An eating disorder. ? Amyloidosis. ? Scleroderma. ? Parkinson's disease. ? Multiple sclerosis. ? Cancer or infection of the stomach or the vagus nerve.  Have had surgery on the stomach or vagus nerve.  Take certain medicines.  Are male. What are the signs or symptoms? Symptoms of this condition include:  Feeling full after eating very  little.  Nausea.  Vomiting.  Heartburn.  Abdominal bloating.  Inconsistent blood sugar (glucose) levels on blood tests.  Lack of appetite.  Weight loss.  Acid from the stomach coming up into the esophagus (gastroesophageal reflux).  Sudden tightening (spasm) of the stomach, which can be painful. Symptoms may come and go. Some people may not notice any symptoms. How is this diagnosed? This condition is diagnosed with tests, such as:  Tests that check how long it takes food to move through the stomach and intestines. These tests include: ? Upper gastrointestinal (GI) series. For this test, you drink a liquid that shows up well on X-rays, and then X-rays will be taken of your intestines. ? Gastric emptying scintigraphy. For this test, you eat food that contains a small amount of radioactive material, and then scans are taken. ? Wireless capsule GI monitoring system. For this test, you swallow a pill (capsule) that records information about how foods and fluid move through your stomach.  Gastric manometry. For this test, a tube is passed down your throat and into your stomach to measure electrical and muscular activity.  Endoscopy. For this test, a long, thin  tube is passed down your throat and into your stomach to check for problems in your stomach lining.  Ultrasound. This test uses sound waves to create images of inside the body. This can help rule out gallbladder disease or pancreatitis as a cause of your symptoms. How is this treated? There is no cure for gastroparesis. Treatment may include:  Treating the underlying cause.  Managing your symptoms by making changes to your diet and exercise habits.  Taking medicines to control nausea and vomiting and to stimulate stomach muscles.  Getting food through a feeding tube in the hospital. This may be done in severe cases.  Having surgery to insert a device into your body that helps improve stomach emptying and control nausea and vomiting (gastric neurostimulator). Follow these instructions at home:  Take over-the-counter and prescription medicines only as told by your health care provider.  Follow instructions from your health care provider about eating or drinking restrictions. Your health care provider may recommend that you: ? Eat smaller meals more often. ? Eat low-fat foods. ? Eat low-fiber forms of high-fiber foods. For example, eat cooked vegetables instead of raw vegetables. ? Have only liquid foods instead of solid foods. Liquid foods are easier to digest.  Drink enough fluid to keep your urine pale yellow.  Exercise as often as told by your health care provider.  Keep all follow-up visits as told by your health care provider. This is important. Contact a health care provider if you:  Notice that your symptoms do not improve with treatment.  Have new symptoms. Get help right away if you:  Have severe abdominal pain that does not improve with treatment.  Have nausea that is severe or does not go away.  Cannot drink fluids without vomiting. Summary  Gastroparesis is a chronic condition in which food takes longer than normal to empty from the stomach.  Symptoms include  nausea, vomiting, heartburn, abdominal bloating, and loss of appetite.  Eating smaller portions, and low-fat, low-fiber foods may help you manage your symptoms.  Get help right away if you have severe abdominal pain. This information is not intended to replace advice given to you by your health care provider. Make sure you discuss any questions you have with your health care provider. Document Revised: 05/26/2017 Document Reviewed: 12/31/2016 Elsevier Patient Education  2020 Else[employer]  Hypoglycemia Hypoglycemia occurs when the level of sugar (glucose) in the blood is too low. Hypoglycemia can happen in people who do or do not have diabetes. It can develop quickly, and it can be a medical emergency. For most people with diabetes, a blood glucose level below 70 mg/dL (3.9 mmol/L) is considered hypoglycemia. Glucose is a type of sugar that provides the body's main source of energy. Certain hormones (insulin and glucagon) control the level of glucose in the blood. Insulin lowers blood glucose, and glucagon raises blood glucose. Hypoglycemia can result from having too much insulin in the bloodstream, or from not eating enough food that contains glucose. You may also have reactive hypoglycemia, which happens within 4 hours after eating a meal. What are the causes? Hypoglycemia occurs most often in people who have diabetes and may be caused by:  Diabetes medicine.  Not eating enough, or not eating often enough.  Increased physical activity.  Drinking alcohol on an empty stomach. If you do not have diabetes, hypoglycemia may be caused by:  A tumor in the pancreas.  Not eating enough, or not eating for long periods at a time (fasting).  A severe infection or illness.  Certain medicines. What increases the risk? Hypoglycemia is more likely to develop in:  People who have diabetes and take medicines to lower blood glucose.  People who abuse alcohol.  People who have a severe  illness. What are the signs or symptoms? Mild symptoms Mild hypoglycemia may not cause any symptoms. If you do have symptoms, they may include:  Hunger.  Anxiety.  Sweating and feeling clammy.  Dizziness or feeling light-headed.  Sleepiness.  Nausea.  Increased heart rate.  Headache.  Blurry vision.  Irritability.  Tingling or numbness around the mouth, lips, or tongue.  A change in coordination.  Restless sleep. Moderate symptoms Moderate hypoglycemia can cause:  Mental confusion and poor judgment.  Behavior changes.  Weakness.  Irregular heartbeat. Severe symptoms Severe hypoglycemia is a medical emergency. It can cause:  Fainting.  Seizures.  Loss of consciousness (coma).  Death. How is this diagnosed? Hypoglycemia is diagnosed with a blood test to measure your blood glucose level. This blood test is done while you are having symptoms. Your health care provider may also do a physical exam and review your medical history. How is this treated? This condition can often be treated by immediately eating or drinking something that contains sugar, such as:  Fruit juice, 4-6 oz (120-150 mL).  Regular soda (not diet soda), 4-6 oz (120-150 mL).  Low-fat milk, 4 oz (120 mL).  Several pieces of hard candy.  Sugar or honey, 1 Tbsp (15 mL). Treating hypoglycemia if you have diabetes If you are alert and able to swallow safely, follow the 15:15 rule:  Take 15 grams of a rapid-acting carbohydrate. Talk with your health care provider about how much you should take.  Rapid-acting options include: ? Glucose pills (take 15 grams). ? 6-8 pieces of hard candy. ? 4-6 oz (120-150 mL) of fruit juice. ? 4-6 oz (120-150 mL) of regular (not diet) soda. ? 1 Tbsp (15 mL) honey or sugar.  Check your blood glucose 15 minutes after you take the carbohydrate.  If the repeat blood glucose level is still at or below 70 mg/dL (3.9 mmol/L), take 15 grams of a carbohydrate  again.  If your blood glucose level does not increase above 70 mg/dL (3.9 mmol/L) after 3 tries, seek emergency medical care.  After your blood  glucose level returns to normal, eat a meal or a snack within 1 hour.  Treating severe hypoglycemia Severe hypoglycemia is when your blood glucose level is at or below 54 mg/dL (3 mmol/L). Severe hypoglycemia is a medical emergency. Get medical help right away. If you have severe hypoglycemia and you cannot eat or drink, you may need an injection of glucagon. A family member or close friend should learn how to check your blood glucose and how to give you a glucagon injection. Ask your health care provider if you need to have an emergency glucagon injection kit available. Severe hypoglycemia may need to be treated in a hospital. The treatment may include getting glucose through an IV. You may also need treatment for the cause of your hypoglycemia. Follow these instructions at home:  General instructions  Take over-the-counter and prescription medicines only as told by your health care provider.  Monitor your blood glucose as told by your health care provider.  Limit alcohol intake to no more than 1 drink a day for nonpregnant women and 2 drinks a day for men. One drink equals 12 oz of beer (355 mL), 5 oz of wine (148 mL), or 1 oz of hard liquor (44 mL).  Keep all follow-up visits as told by your health care provider. This is important. If you have diabetes:  Always have a rapid-acting carbohydrate snack with you to treat low blood glucose.  Follow your diabetes management plan as directed. Make sure you: ? Know the symptoms of hypoglycemia. It is important to treat it right away to prevent it from becoming severe. ? Take your medicines as directed. ? Follow your exercise plan. ? Follow your meal plan. Eat on time, and do not skip meals. ? Check your blood glucose as often as directed. Always check before and after exercise. ? Follow your sick  day plan whenever you cannot eat or drink normally. Make this plan in advance with your health care provider.  Share your diabetes management plan with people in your workplace, school, and household.  Check your urine for ketones when you are ill and as told by your health care provider.  Carry a medical alert card or wear medical alert jewelry. Contact a health care provider if:  You have problems keeping your blood glucose in your target range.  You have frequent episodes of hypoglycemia. Get help right away if:  You continue to have hypoglycemia symptoms after eating or drinking something containing glucose.  Your blood glucose is at or below 54 mg/dL (3 mmol/L).  You have a seizure.  You faint. These symptoms may represent a serious problem that is an emergency. Do not wait to see if the symptoms will go away. Get medical help right away. Call your local emergency services (911 in the [place] Summary  Hypoglycemia occurs when the level of sugar (glucose) in the blood is too low.  Hypoglycemia can happen in people who do or do not have diabetes. It can develop quickly, and it can be a medical emergency.  Make sure you know the symptoms of hypoglycemia and how to treat it.  Always have a rapid-acting carbohydrate snack with you to treat low blood sugar. This information is not intended to replace advice given to you by your health care provider. Make sure you discuss any questions you have with your health care provider. Document Revised: 08/18/2017 Document Reviewed: 03/31/2015 Elsevier Patient Education  2020 [place] With Diabetes Diabetes (type 1 diabetes mellitus or  type 2 diabetes mellitus) is a condition in which the body does not have enough of a hormone called insulin, or the body does not respond properly to insulin. Normally, insulin allows sugars (glucose) to enter cells in the body. The cells use glucose for energy. With diabetes, extra glucose builds  up in the blood instead of going into cells, which results in high blood glucose (hyperglycemia). How to manage lifestyle changes Managing diabetes includes medical treatments as well as lifestyle changes. If diabetes is not managed well, serious physical and emotional complications can occur. Taking good care of yourself means that you are responsible for:  Monitoring glucose regularly.  Eating a healthy diet.  Exercising regularly.  Meeting with health care providers.  Taking medicines as directed. Some people may feel a lot of stress about managing their diabetes. This is known as emotional distress, and it is very common. Living with diabetes can place you at risk for emotional distress, depression, or anxiety. These disorders can be confusing and can make diabetes management more difficult. How to recognize stress Emotional distress Symptoms of emotional distress include:  Anger about having a diagnosis of diabetes.  Fear or frustration about your diagnosis and the changes you need to make to manage the condition.  Being overly worried about the care that you need or the cost of the care that you need.  Feeling like you caused your condition by doing something wrong.  Fear of unpredictable situations, like low or high blood glucose.  Feeling judged by your health care providers.  Feeling very alone with the disease.  Getting too tired or worn out with the demands of daily care. Depression Having diabetes means that you are at a higher risk for depression. Having depression also means that you are at a higher risk for diabetes. Your health care provider may test (screen) you for symptoms of depression. It is important to recognize depression symptoms and to start treatment for depression soon after it is diagnosed. The following are some symptoms of depression:  Loss of interest in things that you used to enjoy.  Trouble sleeping, or often waking up early and not being able  to get back to sleep.  A change in appetite.  Feeling tired most of the day.  Feeling nervous and anxious.  Feeling guilty and worrying that you are a burden to others.  Feeling depressed more often than you do not feel that way.  Thoughts of hurting yourself or feeling that you want to die. If you have any of these symptoms for 2 weeks or longer, reach out to a health care provider. Follow these instructions at home: Managing emotional distress The following are some ways to manage emotional distress:  Talk with your health care provider or certified diabetes educator. Consider working with a counselor or therapist.  Learn as much as you can about diabetes and its treatment. Meet with a certified diabetes educator or take a class to learn how to manage your condition.  Keep a journal of your thoughts and concerns.  Accept that some things are out of your control.  Talk with other people who have diabetes. It can help to talk with others about the emotional distress that you feel.  Find ways to manage stress that work for you. These may include art or music therapy, exercise, meditation, and hobbies.  Seek support from spiritual leaders, family, and friends. General instructions  Follow your diabetes management plan.  Keep all follow-up visits as told by  your health care provider. This is important. Where to find support   Ask your health care provider to recommend a therapist who understands both depression and diabetes.  Search for information and support from the American Diabetes Association: www.diabetes.org  Find a certified diabetes educator and make an appointment through [facility] Diabetes Educators: www.diabeteseducator.org Get help right away if:  You have thoughts about hurting yourself or others. If you ever feel like you may hurt yourself or others, or have thoughts about taking your own life, get help right away. You can go to your nearest  emergency department or call:  Your local emergency services (911 in the [place]  A suicide crisis helpline, such as the [facility] at 8028416397. This is open 24 hours a day. Summary  Diabetes (type 1 diabetes mellitus or type 2 diabetes mellitus) is a condition in which the body does not have enough of a hormone called insulin, or the body does not respond properly to insulin.  Living with diabetes puts you at risk for medical issues, and it also puts you at risk for emotional issues such as emotional distress, depression, and anxiety.  Recognizing the symptoms of emotional distress and depression may help you avoid problems with your diabetes control. It is important to start treatment for emotional distress and depression soon after they are diagnosed.  Having diabetes means that you are at a higher risk for depression. Ask your health care provider to recommend a therapist who understands both depression and diabetes.  If you experience symptoms of emotional distress or depression, it is important to discuss this with your health care provider, certified diabetes educator, or therapist. This information is not intended to replace advice given to you by your health care provider. Make sure you discuss any questions you have with your health care provider. Document Revised: 03/09/2018 Document Reviewed: 07/11/2016 Elsevier Patient Education  [place].  Type 2 Diabetes Mellitus, Self Care, Adult Caring for yourself after you have been diagnosed with type 2 diabetes (type 2 diabetes mellitus) means keeping your blood sugar (glucose) under control with a balance of:  Nutrition.  Exercise.  Lifestyle changes.  Medicines or insulin, if necessary.  Support from your team of health care providers and others. The following information explains what you need to know to manage your diabetes at home. What are the risks? Having diabetes can put you at  risk for other long-term (chronic) conditions, such as heart disease and kidney disease. Your health care provider may prescribe medicines to help prevent complications from diabetes. These medicines may include:  Aspirin.  Medicine to lower cholesterol.  Medicine to control blood pressure. How to monitor blood glucose   Check your blood glucose every day, as often as told by your health care provider.  Have your A1c (hemoglobin A1c) level checked two or more times a year, or as often as told by your health care provider. Your health care provider will set individualized treatment goals for you. Generally, the goal of treatment is to maintain the following blood glucose levels:  Before meals (preprandial): 80-130 mg/dL (4.4-7.2 mmol/L).  After meals (postprandial): below 180 mg/dL (10 mmol/L).  A1c level: less than 7%. How to manage hyperglycemia and hypoglycemia Hyperglycemia symptoms Hyperglycemia, also called high blood glucose, occurs when blood glucose is too high. Make sure you know the early signs of hyperglycemia, such as:  Increased thirst.  Hunger.  Feeling very tired.  Needing to urinate more often than  usual.  Blurry vision. Hypoglycemia symptoms Hypoglycemia, also called low blood glucose, occurswith a blood glucose level at or below 70 mg/dL (3.9 mmol/L). The risk for hypoglycemia increases during or after exercise, during sleep, during illness, and when skipping meals or not eating for a long time (fasting). It is important to know the symptoms of hypoglycemia and treat it right away. Always have a 15-gram rapid-acting carbohydrate snack with you to treat low blood glucose. Family members and close friends should also know the symptoms and should understand how to treat hypoglycemia, in case you are not able to treat yourself. Symptoms may include:  Hunger.  Anxiety.  Sweating and feeling clammy.  Confusion.  Dizziness or feeling  light-headed.  Sleepiness.  Nausea.  Increased heart rate.  Headache.  Blurry vision.  Irritability.  A change in coordination.  Tingling or numbness around the mouth, lips, or tongue.  Restless sleep.  Fainting.  Seizure. Treating hypoglycemia If you are alert and able to swallow safely, follow the 15:15 rule:  Take 15 grams of a rapid-acting carbohydrate. Talk with your health care provider about how much you should take.  Rapid-acting options include: ? Glucose pills (take 15 grams). ? 6-8 pieces of hard candy. ? 4-6 oz (120-150 mL) of fruit juice. ? 4-6 oz (120-150 mL) of regular (not diet) soda. ? 1 Tbsp (15 mL) honey or sugar.  Check your blood glucose 15 minutes after you take the carbohydrate.  If the repeat blood glucose level is still at or below 70 mg/dL (3.9 mmol/L), take 15 grams of a carbohydrate again.  If your blood glucose level does not increase above 70 mg/dL (3.9 mmol/L) after 3 tries, seek emergency medical care.  After your blood glucose level returns to normal, eat a meal or a snack within 1 hour. Treating severe hypoglycemia Severe hypoglycemia is when your blood glucose level is at or below 54 mg/dL (3 mmol/L). Severe hypoglycemia is an emergency. Do not wait to see if the symptoms will go away. Get medical help right away. Call your local emergency services (911 in the [place] If you have severe hypoglycemia and you cannot eat or drink, you may need an injection of glucagon. A family member or close friend should learn how to check your blood glucose and how to give you a glucagon injection. Ask your health care provider if you need to have an emergency glucagon injection kit available. Severe hypoglycemia may need to be treated in a hospital. The treatment may include getting glucose through an IV. You may also need treatment for the cause of your hypoglycemia. Follow these instructions at home: Take diabetes medicines as told  If your health  care provider prescribed insulin or diabetes medicines, take them every day.  Do not run out of insulin or other diabetes medicines that you take. Plan ahead so you always have these available.  If you use insulin, adjust your dosage based on how physically active you are and what foods you eat. Your health care provider will tell you how to adjust your dosage. Make healthy food choices  The things that you eat and drink affect your blood glucose and your insulin dosage. Making good choices helps to control your diabetes and prevent other health problems. A healthy meal plan includes eating lean proteins, complex carbohydrates, fresh fruits and vegetables, low-fat dairy products, and healthy fats. Make an appointment to see a diet and nutrition specialist (registered dietitian) to help you create an eating plan that is  right for you. Make sure that you:  Follow instructions from your health care provider about eating or drinking restrictions.  Drink enough fluid to keep your urine pale yellow.  Keep a record of the carbohydrates that you eat. Do this by reading food labels and learning the standard serving sizes of foods.  Follow your sick day plan whenever you cannot eat or drink as usual. Make this plan in advance with your health care provider.  Stay active Exercise regularly, as told by your health care provider. This may include:  Stretching and doing strength exercises, such as yoga or weightlifting, 2 or more times a week.  Doing 150 minutes or more of moderate-intensity or vigorous-intensity exercise each week. This could be brisk walking, biking, or water aerobics. ? Spread out your activity over 3 or more days of the week. ? Do not go more than 2 days in a row without doing some kind of physical activity. When you start a new exercise or activity, work with your health care provider to adjust your insulin, medicines, or food intake as needed. Make healthy lifestyle choices  Do  not use any tobacco products, such as cigarettes, chewing tobacco, and e-cigarettes. If you need help quitting, ask your health care provider.  If your health care provider says that alcohol is safe for you, limit alcohol intake to no more than 1 drink per day for nonpregnant women and 2 drinks per day for men. One drink equals 12 oz of beer (355 mL), 5 oz of wine (148 mL), or 1 oz of hard liquor (44 mL).  Learn to manage stress. If you need help with this, ask your health care provider. Care for your body   Keep your immunizations up to date. In addition to getting vaccinations as told by your health care provider, it is recommended that you get vaccinated against the following illnesses: ? The flu (influenza). Get a flu shot every year. ? Pneumonia. ? Hepatitis B.  Schedule an eye exam soon after your diagnosis, and then one time every year after that.  Check your skin and feet every day for cuts, bruises, redness, blisters, or sores. Schedule a foot exam with your health care provider once every year.  Brush your teeth and gums two times a day, and floss one or more times a day. Visit your dentist one or more times every 6 months.  Maintain a healthy weight. General instructions  Take over-the-counter and prescription medicines only as told by your health care provider.  Share your diabetes management plan with people in your workplace, school, and household.  Carry a medical alert card or wear medical alert jewelry.  Keep all follow-up visits as told by your health care provider. This is important. Questions to ask your health care provider  Do I need to meet with a diabetes educator?  Where can I find a support group for people with diabetes? Where to find more information For more information about diabetes, visit:  American Diabetes Association (ADA): www.diabetes.org  American Association of Diabetes Educators (AADE): www.diabeteseducator.org Summary  Caring for  yourself after you have been diagnosed with (type 2 diabetes mellitus) means keeping your blood sugar (glucose) under control with a balance of nutrition, exercise, lifestyle changes, and medicine.  Check your blood glucose every day, as often as told by your health care provider.  Having diabetes can put you at risk for other long-term (chronic) conditions, such as heart disease and kidney disease. Your health care provider  may prescribe medicines to help prevent complications from diabetes.  Keep all follow-up visits as told by your health care provider. This is important. This information is not intended to replace advice given to you by your health care provider. Make sure you discuss any questions you have with your health care provider. Document Revised: 08/18/2017 Document Reviewed: 03/31/2015 Elsevier Patient Education  2020 [employer].

## 2019-04-06 ENCOUNTER — Encounter: Payer: Self-pay | Admitting: Registered Nurse

## 2019-04-06 LAB — CBC
Hematocrit: 45.2 % (ref 37.5–51.0)
Hemoglobin: 15.8 g/dL (ref 13.0–17.7)
MCH: 33.5 pg — ABNORMAL HIGH (ref 26.6–33.0)
MCHC: 35 g/dL (ref 31.5–35.7)
MCV: 96 fL (ref 79–97)
Platelets: 220 10*3/uL (ref 150–450)
RBC: 4.72 x10E6/uL (ref 4.14–5.80)
RDW: 12 % (ref 11.6–15.4)
WBC: 3.9 10*3/uL (ref 3.4–10.8)

## 2019-04-06 LAB — COMPREHENSIVE METABOLIC PANEL
ALT: 11 IU/L (ref 0–44)
AST: 14 IU/L (ref 0–40)
Albumin/Globulin Ratio: 1.4 (ref 1.2–2.2)
Albumin: 4.4 g/dL (ref 4.0–5.0)
Alkaline Phosphatase: 39 IU/L (ref 39–117)
BUN/Creatinine Ratio: 10 (ref 9–20)
BUN: 10 mg/dL (ref 6–24)
Bilirubin Total: 0.7 mg/dL (ref 0.0–1.2)
CO2: 24 mmol/L (ref 20–29)
Calcium: 9.3 mg/dL (ref 8.7–10.2)
Chloride: 105 mmol/L (ref 96–106)
Creatinine, Ser: 0.99 mg/dL (ref 0.76–1.27)
GFR calc Af Amer: 102 mL/min/{1.73_m2} (ref >59)
GFR calc non Af Amer: 88 mL/min/{1.73_m2} (ref >59)
Globulin, Total: 3.1 g/dL (ref 1.5–4.5)
Glucose: 129 mg/dL — ABNORMAL HIGH (ref 65–99)
Potassium: 4.1 mmol/L (ref 3.5–5.2)
Sodium: 140 mmol/L (ref 134–144)
Total Protein: 7.5 g/dL (ref 6.0–8.5)

## 2019-04-06 LAB — LIPID PANEL
Chol/HDL Ratio: 3.2 ratio (ref 0.0–5.0)
Cholesterol, Total: 166 mg/dL (ref 100–199)
HDL: 52 mg/dL (ref >39)
LDL Chol Calc (NIH): 104 mg/dL — ABNORMAL HIGH (ref 0–99)
Triglycerides: 51 mg/dL (ref 0–149)
VLDL Cholesterol Cal: 10 mg/dL (ref 5–40)

## 2019-04-06 LAB — TSH: TSH: 2.41 u[IU]/mL (ref 0.450–4.500)

## 2019-04-06 NOTE — Progress Notes (Signed)
Labs not concerning Letter sent via MyChart Pt to follow up in April 2021, appt scheduled  [name], NP

## 2019-04-08 ENCOUNTER — Encounter: Payer: Self-pay | Admitting: Registered Nurse

## 2019-06-23 ENCOUNTER — Ambulatory Visit: Payer: 59 | Admitting: Registered Nurse

## 2019-06-23 ENCOUNTER — Other Ambulatory Visit: Payer: Self-pay

## 2019-06-23 ENCOUNTER — Encounter: Payer: Self-pay | Admitting: Registered Nurse

## 2019-06-23 VITALS — BP 132/73 | HR 83 | Temp 97.7°F | Resp 17 | Ht 66.0 in | Wt 154.8 lb

## 2019-06-23 DIAGNOSIS — N529 Male erectile dysfunction, unspecified: Secondary | ICD-10-CM | POA: Diagnosis not present

## 2019-06-23 DIAGNOSIS — E119 Type 2 diabetes mellitus without complications: Secondary | ICD-10-CM | POA: Diagnosis not present

## 2019-06-23 LAB — POCT GLYCOSYLATED HEMOGLOBIN (HGB A1C): Hemoglobin A1C: 5.3 % (ref 4.0–5.6)

## 2019-06-23 MED ORDER — TADALAFIL 20 MG PO TABS: 10.0000 mg | ORAL_TABLET | ORAL | 0 refills | Status: DC | PRN

## 2019-06-23 NOTE — Patient Instructions (Signed)
° ° ° °  If you have lab work done today you will be contacted with your lab results within the next 2 weeks.  If you have not heard from us then please contact us. The fastest way to get your results is to register for My Chart. ° ° °IF you received an x-ray today, you will receive an invoice from [facility]. Please contact  Radiology at [phone] with questions or concerns regarding your invoice.  ° °IF you received labwork today, you will receive an invoice from [facility]. Please contact [facility] at [phone] with questions or concerns regarding your invoice.  ° °Our billing staff will not be able to assist you with questions regarding bills from these companies. ° °You will be contacted with the lab results as soon as they are available. The fastest way to get your results is to activate your My Chart account. Instructions are located on the last page of this paperwork. If you have not heard from us regarding the results in 2 weeks, please contact this office. °  ° ° ° °

## 2019-06-25 ENCOUNTER — Telehealth: Payer: Self-pay | Admitting: Registered Nurse

## 2019-06-25 NOTE — Telephone Encounter (Signed)
Pt called in about his medication proscribed on 4/14 when he came in. Per pt the pharmacy called insurance about this medication and insurance is needing the reason why pt is on the medication. Pt states that insurance/pharmacy sent in a fax regarding this manner. [id number] Please advise.  tadalafil (CIALIS) 20 MG tablet [[id number]]    [id number] - [place], [place] - [address] AT [address] [facility] [address]

## 2019-06-28 NOTE — Telephone Encounter (Signed)
Pt called back to request prior auth for medication or for provider to send a different prescription. Pt requests to be contacted via [employer]

## 2019-06-29 NOTE — Telephone Encounter (Signed)
Note was given to [facility] to handle.

## 2019-06-30 ENCOUNTER — Other Ambulatory Visit: Payer: Self-pay | Admitting: Registered Nurse

## 2019-06-30 DIAGNOSIS — N529 Male erectile dysfunction, unspecified: Secondary | ICD-10-CM

## 2019-06-30 MED ORDER — TADALAFIL 20 MG PO TABS: 10.0000 mg | ORAL_TABLET | ORAL | 11 refills | Status: DC | PRN

## 2019-07-01 NOTE — Telephone Encounter (Signed)
Hey as we do not know what the medications will cost so the best advise to you is to call the pt and let them know to contact there insurance company and ask them for a cheaper alternative.

## 2019-07-01 NOTE — Telephone Encounter (Signed)
Pt called to check status of his latest request, requesting a call back with updates. [phone]

## 2019-07-01 NOTE — Telephone Encounter (Signed)
Pt requesting a cheaper alternative to the Cialis 20 mg, is there something else he could be prescribed?

## 2019-07-01 NOTE — Telephone Encounter (Signed)
If we could check GoodRx.com for the best prices, I know we can get it down to about $1/per pill at some pharmacies. This would be the best course of action for this patient  Thank you both,  [name], NP

## 2019-07-01 NOTE — Telephone Encounter (Signed)
  Pt called his went to the pharmacy and the Med is 1,500.00 his need something more economic  please Advice Call Pt [phone]

## 2019-07-02 ENCOUNTER — Other Ambulatory Visit: Payer: Self-pay | Admitting: Registered Nurse

## 2019-07-02 DIAGNOSIS — N529 Male erectile dysfunction, unspecified: Secondary | ICD-10-CM

## 2019-07-02 MED ORDER — TADALAFIL 20 MG PO TABS: 10.0000 mg | ORAL_TABLET | ORAL | 3 refills | Status: DC | PRN

## 2019-07-02 NOTE — Telephone Encounter (Signed)
Sent  Thanks  Rich Sander Speckman, NP

## 2019-07-02 NOTE — Progress Notes (Signed)
Pt requesting rx be sent to diff pharmacy for better price  [name], NP

## 2019-07-02 NOTE — Telephone Encounter (Signed)
Found Good Rx coupon for $11.05 informed pt needs it sent to the [facility] [address] can you resend Rx to new pharmacy

## 2019-07-05 ENCOUNTER — Ambulatory Visit: Payer: 59 | Admitting: Registered Nurse

## 2019-07-20 ENCOUNTER — Ambulatory Visit: Payer: 59 | Admitting: Registered Nurse

## 2019-07-20 ENCOUNTER — Other Ambulatory Visit: Payer: Self-pay

## 2019-07-20 ENCOUNTER — Encounter: Payer: Self-pay | Admitting: Registered Nurse

## 2019-07-20 VITALS — BP 133/91 | HR 76 | Temp 97.8°F | Resp 16 | Ht 66.0 in | Wt 152.8 lb

## 2019-07-20 DIAGNOSIS — M62830 Muscle spasm of back: Secondary | ICD-10-CM

## 2019-07-20 DIAGNOSIS — R109 Unspecified abdominal pain: Secondary | ICD-10-CM

## 2019-07-20 DIAGNOSIS — R252 Cramp and spasm: Secondary | ICD-10-CM

## 2019-07-20 LAB — POCT URINALYSIS DIP (CLINITEK)
Bilirubin, UA: NEGATIVE
Glucose, UA: NEGATIVE mg/dL
Ketones, POC UA: NEGATIVE mg/dL
Leukocytes, UA: NEGATIVE
Nitrite, UA: NEGATIVE
POC PROTEIN,UA: NEGATIVE
Spec Grav, UA: >=1.03 — AB (ref 1.010–1.025)
Urobilinogen, UA: 1 E.U./dL
pH, UA: 6 (ref 5.0–8.0)

## 2019-07-20 MED ORDER — PREDNISONE 10 MG (21) PO TBPK: ORAL_TABLET | ORAL | 0 refills | Status: DC

## 2019-07-20 MED ORDER — METHOCARBAMOL 500 MG PO TABS: 500.0000 mg | ORAL_TABLET | Freq: Two times a day (BID) | ORAL | 0 refills | Status: DC | PRN

## 2019-07-20 NOTE — Patient Instructions (Signed)
° ° ° °  If you have lab work done today you will be contacted with your lab results within the next 2 weeks.  If you have not heard from us then please contact us. The fastest way to get your results is to register for My Chart. ° ° °IF you received an x-ray today, you will receive an invoice from [facility]. Please contact [facility] at [phone] with questions or concerns regarding your invoice.  ° °IF you received labwork today, you will receive an invoice from [facility]. Please contact [facility] at [phone] with questions or concerns regarding your invoice.  ° °Our billing staff will not be able to assist you with questions regarding bills from these companies. ° °You will be contacted with the lab results as soon as they are available. The fastest way to get your results is to activate your My Chart account. Instructions are located on the last page of this paperwork. If you have not heard from us regarding the results in 2 weeks, please contact this office. °  ° ° ° °

## 2019-07-21 ENCOUNTER — Encounter: Payer: Self-pay | Admitting: Registered Nurse

## 2019-07-21 LAB — VITAMIN D 25 HYDROXY (VIT D DEFICIENCY, FRACTURES): Vit D, 25-Hydroxy: 29.7 ng/mL — ABNORMAL LOW (ref 30.0–100.0)

## 2019-07-21 LAB — MAGNESIUM: Magnesium: 2.2 mg/dL (ref 1.6–2.3)

## 2019-07-21 LAB — VITAMIN B12: Vitamin B-12: 712 pg/mL (ref 232–1245)

## 2019-07-21 NOTE — Progress Notes (Signed)
Established Patient Office Visit  Subjective:  Patient ID: Javier [name], male    DOB: 1970/06/03  Age: 49 y.o. MRN: [id number]  CC:  Chief Complaint  Patient presents with  . Weight Loss    patient states that he stepped on a scale at home and it read 121.0lbs and he started to panic    HPI [name] presents for concern for weight loss  Scale at home read 121.0 lb. He usually weight between 140-145.  He denies any bowel or bladder changes - no abdominal pain. No change to appetite Does note that he is exercising and dieting strictly to control his T2DM - but did not anticipate this extent of weight loss. No other symptoms of note.   Past Medical History:  Diagnosis Date  . Diabetes mellitus without complication (HCC)   . No pertinent past medical history     Past Surgical History:  Procedure Laterality Date  . NO PAST SURGERIES      Family History  Problem Relation Age of Onset  . Hypertension Neg Hx   . Heart disease Neg Hx   . Cancer Neg Hx   . AAA (abdominal aortic aneurysm) Neg Hx     Social History   Socioeconomic History  . Marital status: Married    Spouse name: Not on file  . Number of children: 3  . Years of education: Not on file  . Highest education level: Not on file  Occupational History  . Not on file  Tobacco Use  . Smoking status: Never Smoker  . Smokeless tobacco: Never Used  Substance and Sexual Activity  . Alcohol use: Yes    Comment: socially  . Drug use: Never  . Sexual activity: Not on file  Other Topics Concern  . Not on file  Social History Narrative   Married with 3 children   Works Financial trader   Social Determinants of Health   Financial Resource Strain:   . Difficulty of Paying Living Expenses:   Food Insecurity:   . Worried About Programme researcher, broadcasting/film/video in the Last Year:   . Barista in the Last Year:   Transportation Needs:   . Freight forwarder (Medical):   Marland Kitchen Lack of Transportation  (Non-Medical):   Physical Activity:   . Days of Exercise per Week:   . Minutes of Exercise per Session:   Stress:   . Feeling of Stress :   Social Connections:   . Frequency of Communication with Friends and Family:   . Frequency of Social Gatherings with Friends and Family:   . Attends Religious Services:   . Active Member of Clubs or Organizations:   . Attends Banker Meetings:   Marland Kitchen Marital Status:   Intimate Partner Violence:   . Fear of Current or Ex-Partner:   . Emotionally Abused:   Marland Kitchen Physically Abused:   . Sexually Abused:     Outpatient Medications Prior to Visit  Medication Sig Dispense Refill  . acetaminophen (TYLENOL) 325 MG tablet Take 2 tablets (650 mg total) by mouth every 6 (six) hours as needed for mild pain or fever (or Fever >/= 101). (Patient not taking: Reported on 06/23/2019)    . albuterol (VENTOLIN HFA) 108 (90 Base) MCG/ACT inhaler Inhale 1-2 puffs into the lungs every 6 (six) hours as needed for wheezing. (Patient not taking: Reported on 04/05/2019) 1 Inhaler 0  . azithromycin (ZITHROMAX) 500 MG tablet Take 1 tablet (500 mg  total) by mouth daily. (Patient not taking: Reported on 04/05/2019) 3 tablet 0  . hydrOXYzine (ATARAX/VISTARIL) 25 MG tablet Take 2 tablets (50 mg total) by mouth at bedtime. As needed for sleep (Patient not taking: Reported on 04/05/2019) 14 tablet 0  . Melatonin 5 MG TABS Take 1 tablet (5 mg total) by mouth at bedtime as needed. (Patient not taking: Reported on 04/05/2019) 15 tablet 0  . metFORMIN (GLUCOPHAGE) 500 MG tablet Take 500 mg by mouth 2 (two) times daily with a meal.    . vitamin C (VITAMIN C) 500 MG tablet Take 1 tablet (500 mg total) by mouth daily. Please take for 2 weeks (Patient not taking: Reported on 04/05/2019)    . zinc sulfate 220 (50 Zn) MG capsule Take 1 capsule (220 mg total) by mouth daily. Please take for 2 days (Patient not taking: Reported on 06/23/2019)     No facility-administered medications prior to  visit.    No Known Allergies  ROS Review of Systems Per hpi, otherwise negative per 10pt ros    Objective:    Physical Exam  Constitutional: He is oriented to person, place, and time. He appears well-developed and well-nourished. No distress.  Cardiovascular: Normal rate, regular rhythm and normal heart sounds. Exam reveals no gallop and no friction rub.  No murmur heard. Pulmonary/Chest: Effort normal and breath sounds normal. No respiratory distress. He has no wheezes. He has no rales. He exhibits no tenderness.  Abdominal: Soft. Bowel sounds are normal. He exhibits no distension and no mass. There is no abdominal tenderness. There is no rebound and no guarding.  Musculoskeletal:        General: Normal range of motion.  Neurological: He is alert and oriented to person, place, and time. No cranial nerve deficit.  Skin: Skin is warm and dry. No rash noted. He is not diaphoretic. No erythema. No pallor.  Psychiatric: He has a normal mood and affect. His behavior is normal. Judgment and thought content normal.  Nursing note and vitals reviewed.   BP 132/73   Pulse 83   Temp 97.7 F (36.5 C) (Temporal)   Resp 17   Ht 5\' 6"  (1.676 m)   Wt 154 lb 12.8 oz (70.2 kg)   SpO2 97%   BMI 24.99 kg/m  Wt Readings from Last 3 Encounters:  07/20/19 152 lb 12.8 oz (69.3 kg)  06/23/19 154 lb 12.8 oz (70.2 kg)  04/05/19 157 lb 3.2 oz (71.3 kg)     Health Maintenance Due  Topic Date Due  . OPHTHALMOLOGY EXAM  Never done    There are no preventive care reminders to display for this patient.  Lab Results  Component Value Date   TSH 2.410 04/05/2019   Lab Results  Component Value Date   WBC 3.9 04/05/2019   HGB 15.8 04/05/2019   HCT 45.2 04/05/2019   MCV 96 04/05/2019   PLT 220 04/05/2019   Lab Results  Component Value Date   NA 140 04/05/2019   K 4.1 04/05/2019   CO2 24 04/05/2019   GLUCOSE 129 (H) 04/05/2019   BUN 10 04/05/2019   CREATININE 0.99 04/05/2019   BILITOT  0.7 04/05/2019   ALKPHOS 39 04/05/2019   AST 14 04/05/2019   ALT 11 04/05/2019   PROT 7.5 04/05/2019   ALBUMIN 4.4 04/05/2019   CALCIUM 9.3 04/05/2019   ANIONGAP 10 07/07/2018   Lab Results  Component Value Date   CHOL 166 04/05/2019   Lab Results  Component  Value Date   HDL 52 04/05/2019   Lab Results  Component Value Date   LDLCALC 104 (H) 04/05/2019   Lab Results  Component Value Date   TRIG 51 04/05/2019   Lab Results  Component Value Date   CHOLHDL 3.2 04/05/2019   Lab Results  Component Value Date   HGBA1C 5.3 06/23/2019      Assessment & Plan:   Problem List Items Addressed This Visit      Endocrine   Type 2 diabetes mellitus without complication, without long-term current use of insulin (HCC) - Primary   Relevant Orders   POCT glycosylated hemoglobin (Hb A1C) (Completed)    Other Visit Diagnoses    Erectile dysfunction, unspecified erectile dysfunction type          Meds ordered this encounter  Medications  . DISCONTD: tadalafil (CIALIS) 20 MG tablet    Sig: Take 0.5-1 tablets (10-20 mg total) by mouth every other day as needed for erectile dysfunction.    Dispense:  30 tablet    Refill:  0    Order Specific Question:   Supervising Provider    Answer:   Doristine Bosworth [id number]    Follow-up: No follow-ups on file.   PLAN  Our scale in office has him weighing in much closer to his usual weight. He either had a false reading at home or his scale is not properly calibrated - suggest rechecking at home  Pt very reassured  Return for routine follow up, sooner with any concerns  Patient encouraged to call clinic with any questions, comments, or concerns.  Janeece Agee, NP

## 2019-07-22 ENCOUNTER — Encounter: Payer: Self-pay | Admitting: Registered Nurse

## 2019-09-10 ENCOUNTER — Encounter: Payer: Self-pay | Admitting: Registered Nurse

## 2019-09-10 ENCOUNTER — Ambulatory Visit: Payer: 59 | Admitting: Registered Nurse

## 2019-09-10 ENCOUNTER — Other Ambulatory Visit: Payer: Self-pay

## 2019-09-10 DIAGNOSIS — E119 Type 2 diabetes mellitus without complications: Secondary | ICD-10-CM

## 2019-09-10 DIAGNOSIS — M62830 Muscle spasm of back: Secondary | ICD-10-CM

## 2019-09-10 DIAGNOSIS — Z8616 Personal history of COVID-19: Secondary | ICD-10-CM

## 2019-09-10 LAB — POCT GLYCOSYLATED HEMOGLOBIN (HGB A1C): Hemoglobin A1C: 5.6 % (ref 4.0–5.6)

## 2019-09-10 MED ORDER — GABAPENTIN 100 MG PO CAPS: 100.0000 mg | ORAL_CAPSULE | Freq: Three times a day (TID) | ORAL | 3 refills | Status: DC

## 2019-09-10 MED ORDER — METHOCARBAMOL 500 MG PO TABS: 500.0000 mg | ORAL_TABLET | Freq: Two times a day (BID) | ORAL | 0 refills | Status: DC | PRN
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
## 2019-09-10 NOTE — Patient Instructions (Signed)
° ° ° °  If you have lab work done today you will be contacted with your lab results within the next 2 weeks.  If you have not heard from us then please contact us. The fastest way to get your results is to register for My Chart. ° ° °IF you received an x-ray today, you will receive an invoice from [facility]. Please contact [facility] at [phone] with questions or concerns regarding your invoice.  ° °IF you received labwork today, you will receive an invoice from [facility]. Please contact [facility] at [phone] with questions or concerns regarding your invoice.  ° °Our billing staff will not be able to assist you with questions regarding bills from these companies. ° °You will be contacted with the lab results as soon as they are available. The fastest way to get your results is to activate your My Chart account. Instructions are located on the last page of this paperwork. If you have not heard from us regarding the results in 2 weeks, please contact this office. °  ° ° ° °

## 2019-09-12 LAB — SARS-COV-2 ANTIBODY, IGM: SARS-CoV-2 Antibody, IgM: POSITIVE

## 2019-09-28 ENCOUNTER — Other Ambulatory Visit: Payer: Self-pay

## 2019-09-28 ENCOUNTER — Ambulatory Visit: Payer: 59 | Attending: Registered Nurse

## 2019-09-28 DIAGNOSIS — M62838 Other muscle spasm: Secondary | ICD-10-CM | POA: Diagnosis present

## 2019-09-28 DIAGNOSIS — R29898 Other symptoms and signs involving the musculoskeletal system: Secondary | ICD-10-CM | POA: Diagnosis present

## 2019-09-28 DIAGNOSIS — M542 Cervicalgia: Secondary | ICD-10-CM | POA: Diagnosis not present

## 2019-09-28 DIAGNOSIS — M546 Pain in thoracic spine: Secondary | ICD-10-CM | POA: Diagnosis present
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
## 2019-09-29 NOTE — Patient Instructions (Signed)
Cervical retraction in sitting, cervical retraction in supine, cervical rotation c gentle overpressure

## 2019-10-11 ENCOUNTER — Ambulatory Visit: Payer: 59 | Attending: Registered Nurse | Admitting: Physical Therapy

## 2019-10-11 ENCOUNTER — Other Ambulatory Visit: Payer: Self-pay

## 2019-10-11 ENCOUNTER — Encounter: Payer: Self-pay | Admitting: Physical Therapy

## 2019-10-11 DIAGNOSIS — R29898 Other symptoms and signs involving the musculoskeletal system: Secondary | ICD-10-CM | POA: Diagnosis present

## 2019-10-11 DIAGNOSIS — M542 Cervicalgia: Secondary | ICD-10-CM | POA: Diagnosis present

## 2019-10-11 DIAGNOSIS — M62838 Other muscle spasm: Secondary | ICD-10-CM | POA: Diagnosis present

## 2019-10-11 DIAGNOSIS — M546 Pain in thoracic spine: Secondary | ICD-10-CM | POA: Insufficient documentation
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
## 2019-10-14 ENCOUNTER — Ambulatory Visit: Payer: 59

## 2019-10-14 ENCOUNTER — Other Ambulatory Visit: Payer: Self-pay

## 2019-10-14 DIAGNOSIS — R29898 Other symptoms and signs involving the musculoskeletal system: Secondary | ICD-10-CM

## 2019-10-14 DIAGNOSIS — M62838 Other muscle spasm: Secondary | ICD-10-CM

## 2019-10-14 DIAGNOSIS — M542 Cervicalgia: Secondary | ICD-10-CM

## 2019-10-14 DIAGNOSIS — M546 Pain in thoracic spine: Secondary | ICD-10-CM

## 2019-10-14 NOTE — Therapy (Signed)
[facility] Outpatient Rehabilitation [facility] [address] [place], [place], [address] Phone: [phone]   Fax:  [phone]  Physical Therapy Treatment  Patient Details  Name: Javier [name] MRN: [id number] Date of Birth: August 17, 1970 Referring Provider (PT): Janeece Agee, NP   Encounter Date: 10/14/2019   PT End of Session - 10/14/19 2239    Visit Number 3    Number of Visits 13    Date for PT Re-Evaluation 11/17/19    Authorization Type [employer]    PT Start Time 1530    PT Stop Time 1615    PT Time Calculation (min) 45 min    Activity Tolerance Patient tolerated treatment well    Behavior During Therapy [facility] for tasks assessed/performed           Past Medical History:  Diagnosis Date  . Diabetes mellitus without complication (HCC)   . No pertinent past medical history     Past Surgical History:  Procedure Laterality Date  . NO PAST SURGERIES      There were no vitals filed for this visit.   Subjective Assessment - 10/14/19 1536    Subjective Pt reports improvement of neck pain and HA. Pt reports he has been completing his HEP. Pt reports returning to driving 4 to 5hrs a day several days a week    Patient Stated Goals For pain to improve so I can return to my usual activities    Currently in Pain? Yes    Pain Score 7     Pain Location Neck    Pain Orientation Right;Left    Pain Descriptors / Indicators Sore    Pain Type Chronic pain    Pain Radiating Towards NA    Pain Onset More than a month ago    Pain Frequency Constant    Aggravating Factors  Standing    Pain Relieving Factors Lying down    Effect of Pain on Daily Activities Significant impact on daily activities                             [facility] Adult PT Treatment/Exercise - 10/14/19 0001      Self-Care   Self-Care Other Self-Care Comments    Other Self-Care Comments  See Eduaction      Manual Therapy   Manual Therapy Soft tissue mobilization;Manual  Traction    Soft tissue mobilization STW to the cervical parapinals and upper trap c trigger point release of the rSTW also fto the temporalis musles bilat.    Manual Traction Axila cervical traction                  PT Education - 10/14/19 2232    Education Details Education in proper lumbar support (filling the lumbar curve) for better cervical posture. Sleeping positions with support to promote a neutral neck in SL and supine. Improved cervical posture and proper sleeping positions and support will assist in pain reduction and management. Pt is currently sleeping prone. Massage to the temples for HA relief.    Person(s) Educated Patient    Methods Explanation;Demonstration    Comprehension Verbalized understanding;Returned demonstration;Verbal cues required;Tactile cues required;Need further instruction            PT Short Term Goals - 09/29/19 2130      PT SHORT TERM GOAL #1   Title Pt will be ind in an initial HEP    Baseline Started today    Time  3    Period Weeks    Status New    Target Date 10/20/19      PT SHORT TERM GOAL #2   Title Pt will voice understanding of measures to reduce and management pain.    Time 3    Period Weeks    Status New    Target Date 10/20/19      PT SHORT TERM GOAL #3   Title improve cervical ROM by 50% of current values    Baseline See flowsheets    Time 3    Period Weeks    Target Date 10/20/19             PT Long Term Goals - 09/29/19 2134      PT LONG TERM GOAL #1   Title Improve cervical ROM by 90% of initial values    Baseline See flowsheets    Time 7    Period Weeks    Status New    Target Date 11/17/19      PT LONG TERM GOAL #2   Title Pt wil report a pain range of 0-4/10 with daily activities    Baseline 7/10    Time 7    Period Weeks    Status New    Target Date 11/17/19      PT LONG TERM GOAL #3   Title Pt's FOTO score will improve to 46% limitation    Baseline 65% Limitation    Time 7    Period  Weeks    Status New    Target Date 11/17/19      PT LONG TERM GOAL #4   Title Pt will be ind in a final HEP    Time 7    Period Weeks    Status New    Target Date 11/17/19                 Plan - 10/14/19 2240    Clinical Impression Statement PT focused on sitting posture and positioning and support with sleeping, STW for cervical paraspinals, upper traps, and temporalis, and manual axial cerical traction. Pt reported a reduction of cervical pain to 5/10 after session.    Personal Factors and Comorbidities Comorbidity 1    Comorbidities DM    Examination-Activity Limitations Carry;Caring for Others;Lift;Stand;Sleep;Sit;Reach Overhead;Other    Examination-Participation Restrictions Driving;Yard Work    Stability/Clinical Decision Making Evolving/Moderate complexity    Clinical Decision Making Moderate    Rehab Potential Good    PT Frequency 2x / week    PT Treatment/Interventions Cryotherapy;Electrical Stimulation;Ultrasound;Traction;Moist Heat;Iontophoresis 4mg /ml Dexamethasone;Functional mobility training;Therapeutic activities;Therapeutic exercise;Manual techniques;Patient/family education;Passive range of motion;Dry needling;Taping    PT Next Visit Plan continue DN PRN. assess bil knee & foot pain PRN. Continue ther ex and postural Ed for the neck. Assess response to temporalis STW.    PT Home Exercise Plan [phone]: retraction in sitting, cervical retraction in supine, cervical rotation c gentle overpressure    Consulted and Agree with Plan of Care Patient           Patient will benefit from skilled therapeutic intervention in order to improve the following deficits and impairments:  Decreased activity tolerance, Decreased mobility, Decreased strength, Postural dysfunction, Pain, Increased muscle spasms, Decreased range of motion, Hypomobility, Difficulty walking  Visit Diagnosis: Acute neck pain  Acute midline thoracic back pain  Muscle spasms of neck  Decreased  ROM of neck     Problem List Patient Active Problem List   Diagnosis Date Noted  .  Type 2 diabetes mellitus without complication, without long-term current use of insulin (HCC) 04/05/2019  . COVID-19 07/03/2018  . Sepsis (HCC) 07/03/2018  . Shortness of breath 07/03/2018  . Hyponatremia 07/03/2018  . Fever 07/03/2018  . Rhinitis, allergic 05/22/2015  . Elevated BP 05/22/2015    Joellyn Rued MS, PT 10/14/19 10:56 PM  [facility] Outpatient Rehabilitation [facility] [address] [place], [place], [address] Phone: [phone]   Fax:  [phone]  Name: Javier [name] MRN: [id number] Date of Birth: 04-20-1970

## 2019-10-18 ENCOUNTER — Ambulatory Visit: Payer: 59 | Admitting: Physical Therapy

## 2019-10-20 ENCOUNTER — Ambulatory Visit: Payer: 59 | Admitting: Physical Therapy

## 2019-10-26 ENCOUNTER — Other Ambulatory Visit: Payer: Self-pay

## 2019-10-26 ENCOUNTER — Ambulatory Visit: Payer: 59 | Admitting: Physical Therapy

## 2019-10-26 ENCOUNTER — Encounter: Payer: Self-pay | Admitting: Physical Therapy

## 2019-10-26 DIAGNOSIS — R29898 Other symptoms and signs involving the musculoskeletal system: Secondary | ICD-10-CM

## 2019-10-26 DIAGNOSIS — M62838 Other muscle spasm: Secondary | ICD-10-CM

## 2019-10-26 DIAGNOSIS — M542 Cervicalgia: Secondary | ICD-10-CM | POA: Diagnosis not present

## 2019-10-26 DIAGNOSIS — M546 Pain in thoracic spine: Secondary | ICD-10-CM

## 2019-10-26 NOTE — Therapy (Signed)
[facility] Outpatient Rehabilitation [facility] [address] [place], [place], [address] Phone: [phone]   Fax:  [phone]  Physical Therapy Treatment  Patient Details  Name: Javier [name] MRN: [id number] Date of Birth: [birth year] Referring Provider (PT): Janeece Agee, NP   Encounter Date: 10/26/2019   PT End of Session - 10/26/19 1800    Visit Number 4    Number of Visits 13    Date for PT Re-Evaluation 11/17/19    Authorization Type [employer]    PT Start Time 1758    PT Stop Time 1837    PT Time Calculation (min) 39 min    Activity Tolerance Patient tolerated treatment well    Behavior During Therapy [facility] for tasks assessed/performed           Past Medical History:  Diagnosis Date  . Diabetes mellitus without complication (HCC)   . No pertinent past medical history     Past Surgical History:  Procedure Laterality Date  . NO PAST SURGERIES      There were no vitals filed for this visit.   Subjective Assessment - 10/26/19 1809    Subjective Reports DN was helpful. The bggest problem is that he cannot find a comfortable position to sleep in.    Patient Stated Goals For pain to improve so I can return to my usual activities                             [facility] Adult PT Treatment/Exercise - 10/26/19 0001      Therapeutic Activites    Therapeutic Activities Other Therapeutic Activities    Other Therapeutic Activities bed positioning- pillows around neck      Neck Exercises: Machines for Strengthening   UBE (Upper Arm Bike) retro 3 min L1      Neck Exercises: Standing   Wall Push Ups 20 reps;10 reps    UE Flexion with Stabilization Limitations lower trap set off of wall    Other Standing Exercises row green tband    Other Standing Exercises extension red tband      Neck Exercises: Seated   Shoulder Rolls Backwards;10 reps    Other Seated Exercise resting posture/scap retraction      Manual Therapy   Manual Therapy  Joint mobilization    Manual therapy comments skilled palpation and monitoring during TPDN    Joint Mobilization rt first rib depression    Soft tissue mobilization bil upper traps      Neck Exercises: Stretches   Upper Trapezius Stretch Right;Left;30 seconds            Trigger Point Dry Needling - 10/26/19 0001    Upper Trapezius Response Twitch reponse elicited;Palpable increased muscle length   bil                 PT Short Term Goals - 09/29/19 2130      PT SHORT TERM GOAL #1   Title Pt will be ind in an initial HEP    Baseline Started today    Time 3    Period Weeks    Status New    Target Date 10/20/19      PT SHORT TERM GOAL #2   Title Pt will voice understanding of measures to reduce and management pain.    Time 3    Period Weeks    Status New    Target Date 10/20/19      PT  SHORT TERM GOAL #3   Title improve cervical ROM by 50% of current values    Baseline See flowsheets    Time 3    Period Weeks    Target Date 10/20/19             PT Long Term Goals - 09/29/19 2134      PT LONG TERM GOAL #1   Title Improve cervical ROM by 90% of initial values    Baseline See flowsheets    Time 7    Period Weeks    Status New    Target Date 11/17/19      PT LONG TERM GOAL #2   Title Pt wil report a pain range of 0-4/10 with daily activities    Baseline 7/10    Time 7    Period Weeks    Status New    Target Date 11/17/19      PT LONG TERM GOAL #3   Title Pt's FOTO score will improve to 46% limitation    Baseline 65% Limitation    Time 7    Period Weeks    Status New    Target Date 11/17/19      PT LONG TERM GOAL #4   Title Pt will be ind in a final HEP    Time 7    Period Weeks    Status New    Target Date 11/17/19                 Plan - 10/26/19 1837    Clinical Impression Statement Single stick to each upper trap to reduce tension today. discussed the importance of posture to support work done in exercises. Was able to feel  the benefit in pillow placement and will try at home.    PT Treatment/Interventions Cryotherapy;Electrical Stimulation;Ultrasound;Traction;Moist Heat;Iontophoresis 4mg /ml Dexamethasone;Functional mobility training;Therapeutic activities;Therapeutic exercise;Manual techniques;Patient/family education;Passive range of motion;Dry needling;Taping    PT Next Visit Plan how did he do with pillows? DN PRN, cont periscapular strength for posture support    PT Home Exercise Plan [id number]: retraction in sitting, cervical retraction in supine, cervical rotation c gentle overpressure, row, ext    Consulted and Agree with Plan of Care Patient           Patient will benefit from skilled therapeutic intervention in order to improve the following deficits and impairments:  Decreased activity tolerance, Decreased mobility, Decreased strength, Postural dysfunction, Pain, Increased muscle spasms, Decreased range of motion, Hypomobility, Difficulty walking  Visit Diagnosis: Acute neck pain  Acute midline thoracic back pain  Muscle spasms of neck  Decreased ROM of neck     Problem List Patient Active Problem List   Diagnosis Date Noted  . Type 2 diabetes mellitus without complication, without long-term current use of insulin (HCC) 04/05/2019  . COVID-19 07/03/2018  . Sepsis (HCC) 07/03/2018  . Shortness of breath 07/03/2018  . Hyponatremia 07/03/2018  . Fever 07/03/2018  . Rhinitis, allergic 05/22/2015  . Elevated BP 05/22/2015   Javier [name] C. Soleia Badolato PT, DPT 10/26/19 6:39 PM   [facility] Outpatient Rehabilitation [facility] [address] [place], [place], [place] Phone: [phone]   Fax:  [phone]  Name: Javier [name] MRN: Bernell List Date of Birth: Nov 21, 1970

## 2019-10-28 ENCOUNTER — Other Ambulatory Visit: Payer: Self-pay

## 2019-10-28 ENCOUNTER — Ambulatory Visit: Payer: 59 | Admitting: Physical Therapy

## 2019-10-28 ENCOUNTER — Encounter: Payer: Self-pay | Admitting: Physical Therapy

## 2019-10-28 DIAGNOSIS — M546 Pain in thoracic spine: Secondary | ICD-10-CM

## 2019-10-28 DIAGNOSIS — M542 Cervicalgia: Secondary | ICD-10-CM

## 2019-10-28 DIAGNOSIS — M62838 Other muscle spasm: Secondary | ICD-10-CM

## 2019-10-28 DIAGNOSIS — R29898 Other symptoms and signs involving the musculoskeletal system: Secondary | ICD-10-CM
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
## 2019-11-02 ENCOUNTER — Ambulatory Visit: Payer: 59

## 2019-11-04 ENCOUNTER — Telehealth: Payer: Self-pay

## 2019-11-04 ENCOUNTER — Ambulatory Visit: Payer: 59

## 2019-11-04 NOTE — Telephone Encounter (Signed)
Spoked with pt re: no show visit. Advised re: no show policy and reminded of upcoming appt on 8/31.

## 2019-11-09 ENCOUNTER — Ambulatory Visit: Payer: 59

## 2019-11-09 ENCOUNTER — Other Ambulatory Visit: Payer: Self-pay

## 2019-11-09 DIAGNOSIS — M546 Pain in thoracic spine: Secondary | ICD-10-CM

## 2019-11-09 DIAGNOSIS — R29898 Other symptoms and signs involving the musculoskeletal system: Secondary | ICD-10-CM

## 2019-11-09 DIAGNOSIS — M542 Cervicalgia: Secondary | ICD-10-CM

## 2019-11-09 DIAGNOSIS — M62838 Other muscle spasm: Secondary | ICD-10-CM

## 2019-11-09 NOTE — Therapy (Signed)
[facility] Outpatient Rehabilitation [facility] [address] [place], [place], [address] Phone: [phone]   Fax:  [phone]  Physical Therapy Treatment  Patient Details  Name: Javier [name] MRN: [id number] Date of Birth: 07/05/1970 Referring Provider (PT): Janeece Agee, NP   Encounter Date: 11/09/2019   PT End of Session - 11/09/19 1915    Visit Number 6    Number of Visits 13    Date for PT Re-Evaluation 11/17/19    Authorization Type [employer]    PT Start Time 1710   pt was 10 mins late for appt   PT Stop Time 1753    PT Time Calculation (min) 43 min    Activity Tolerance Patient tolerated treatment well    Behavior During Therapy [facility] for tasks assessed/performed           Past Medical History:  Diagnosis Date  . Diabetes mellitus without complication (HCC)   . No pertinent past medical history     Past Surgical History:  Procedure Laterality Date  . NO PAST SURGERIES      There were no vitals filed for this visit.   Subjective Assessment - 11/09/19 1717    Subjective Pt reports he is doing better re; pain. Sleep is better, but still bothers him. Pt reports he is working approx 25-30 hours a week, aboiut half of waht her used to do.    Currently in Pain? Yes    Pain Score 3     Pain Location Neck   Upper shoulders   Pain Orientation Right;Left    Pain Descriptors / Indicators Sore    Pain Type Chronic pain    Pain Onset More than a month ago    Pain Frequency Intermittent                             [facility] Adult PT Treatment/Exercise - 11/09/19 0001      Neck Exercises: Standing   Other Standing Exercises row green tband; 15x    Other Standing Exercises extension green tband      Neck Exercises: Seated   Other Seated Exercise external rotation red tband 15x      Manual Therapy   Manual Therapy Soft tissue mobilization    Soft tissue mobilization STW to the bilat upper traps, rhomboids, and thoracic  paraspinals. Trigger point release of the upper traps, L > R.                  PT Education - 11/09/19 1913    Education Details Demonstrated use of a towel to use as a cervical roll with his pillow. With demonstration, pt found the use of the cervical roll to comfortable and helpful.    Person(s) Educated Patient    Methods Explanation;Demonstration    Comprehension Verbalized understanding;Returned demonstration;Verbal cues required;Tactile cues required;Need further instruction            PT Short Term Goals - 09/29/19 2130      PT SHORT TERM GOAL #1   Title Pt will be ind in an initial HEP    Baseline Started today    Time 3    Period Weeks    Status New    Target Date 10/20/19      PT SHORT TERM GOAL #2   Title Pt will voice understanding of measures to reduce and management pain.    Time 3    Period Weeks  Status New    Target Date 10/20/19      PT SHORT TERM GOAL #3   Title improve cervical ROM by 50% of current values    Baseline See flowsheets    Time 3    Period Weeks    Target Date 10/20/19             PT Long Term Goals - 09/29/19 2134      PT LONG TERM GOAL #1   Title Improve cervical ROM by 90% of initial values    Baseline See flowsheets    Time 7    Period Weeks    Status New    Target Date 11/17/19      PT LONG TERM GOAL #2   Title Pt wil report a pain range of 0-4/10 with daily activities    Baseline 7/10    Time 7    Period Weeks    Status New    Target Date 11/17/19      PT LONG TERM GOAL #3   Title Pt's FOTO score will improve to 46% limitation    Baseline 65% Limitation    Time 7    Period Weeks    Status New    Target Date 11/17/19      PT LONG TERM GOAL #4   Title Pt will be ind in a final HEP    Time 7    Period Weeks    Status New    Target Date 11/17/19                 Plan - 11/09/19 1917    Clinical Impression Statement Increased muscle tension/trigger point was noted of the L trapezius in  comparison to the R with STW. Pt found the addition of a towel for a cervical roll to be helpful. pt's report indictaes decreased pain fo the cervical/upper trap region.    Personal Factors and Comorbidities Comorbidity 1    Comorbidities DM    Examination-Activity Limitations Carry;Caring for Others;Lift;Stand;Sleep;Sit;Reach Overhead;Other    Examination-Participation Restrictions Driving;Yard Work    Conservation officer, historic buildings Evolving/Moderate complexity    Clinical Decision Making Moderate    Rehab Potential Good    PT Frequency 2x / week    PT Duration 6 weeks    PT Treatment/Interventions Cryotherapy;Electrical Stimulation;Ultrasound;Traction;Moist Heat;Iontophoresis 4mg /ml Dexamethasone;Functional mobility training;Therapeutic activities;Therapeutic exercise;Manual techniques;Patient/family education;Passive range of motion;Dry needling;Taping    PT Next Visit Plan 6th visit FOTO; reassess cervical ROM    PT Home Exercise Plan [phone]: retraction in sitting, cervical retraction in supine, cervical rotation c gentle overpressure, row, ext    Consulted and Agree with Plan of Care Patient           Patient will benefit from skilled therapeutic intervention in order to improve the following deficits and impairments:  Decreased activity tolerance, Decreased mobility, Decreased strength, Postural dysfunction, Pain, Increased muscle spasms, Decreased range of motion, Hypomobility, Difficulty walking  Visit Diagnosis: Acute neck pain  Acute midline thoracic back pain  Muscle spasms of neck  Decreased ROM of neck     Problem List Patient Active Problem List   Diagnosis Date Noted  . Type 2 diabetes mellitus without complication, without long-term current use of insulin (HCC) 04/05/2019  . COVID-19 07/03/2018  . Sepsis (HCC) 07/03/2018  . Shortness of breath 07/03/2018  . Hyponatremia 07/03/2018  . Fever 07/03/2018  . Rhinitis, allergic 05/22/2015  . Elevated BP  05/22/2015    Inis Borneman MS, PT 11/09/19  7:24 PM  [facility] [address] [place], [place], [address] Phone: [phone]   Fax:  [phone]  Name: Javier [name] MRN: [id number] Date of Birth: 1970/07/15

## 2019-11-11 ENCOUNTER — Other Ambulatory Visit: Payer: Self-pay

## 2019-11-11 ENCOUNTER — Ambulatory Visit: Payer: 59 | Attending: Registered Nurse

## 2019-11-11 DIAGNOSIS — R29898 Other symptoms and signs involving the musculoskeletal system: Secondary | ICD-10-CM | POA: Diagnosis present

## 2019-11-11 DIAGNOSIS — M542 Cervicalgia: Secondary | ICD-10-CM | POA: Diagnosis present

## 2019-11-11 DIAGNOSIS — M546 Pain in thoracic spine: Secondary | ICD-10-CM | POA: Diagnosis present

## 2019-11-11 DIAGNOSIS — M62838 Other muscle spasm: Secondary | ICD-10-CM | POA: Insufficient documentation

## 2019-11-12 NOTE — Therapy (Signed)
[facility] Outpatient Rehabilitation [facility] [address] [place], [place], [address] Phone: [phone]   Fax:  [phone]  Physical Therapy Treatment  Patient Details  Name: Javier [name] MRN: [id number] Date of Birth: 03-29-1970 Referring Provider (PT): Janeece Agee, NP   Encounter Date: 11/11/2019   PT End of Session - 11/12/19 1523    Visit Number 7    Number of Visits 13    Date for PT Re-Evaluation 11/17/19    Authorization Type [employer]    PT Start Time 1704    PT Stop Time 1752    PT Time Calculation (min) 48 min    Activity Tolerance Patient tolerated treatment well    Behavior During Therapy [facility] for tasks assessed/performed           Past Medical History:  Diagnosis Date  . Diabetes mellitus without complication (HCC)   . No pertinent past medical history     Past Surgical History:  Procedure Laterality Date  . NO PAST SURGERIES      There were no vitals filed for this visit.   Subjective Assessment - 11/11/19 1714    Subjective Pt reports continued improvement in his neck/upper shoulder pain c the towel/cervical roll added to his pillow and STW to the upper trap.    Currently in Pain? Yes    Pain Score 2     Pain Location Neck   upper shoulders   Pain Orientation Right;Left    Pain Type Chronic pain              [facility] PT Assessment - 11/12/19 0001      AROM   Cervical Flexion 35    Cervical Extension 18    Cervical - Right Side Bend 24    Cervical - Left Side Bend 23    Cervical - Right Rotation 23   38 after treatment   Cervical - Left Rotation 29   45 after treatment                        [facility] Adult PT Treatment/Exercise - 11/12/19 0001      Neck Exercises: Seated   Neck Retraction 10 reps    Cervical Rotation 10 reps    Cervical Rotation Limitations 3-5 pt overpressure    Other Seated Exercise external rotation red tband 15x      Manual Therapy   Manual Therapy Soft tissue  mobilization;Manual Traction    Soft tissue mobilization STW to the bilat upper traps, cervical paraspinals, and occipital paraspinals. Trigger point release of the upper traps, L > R.     Manual Traction cervical      Neck Exercises: Stretches   Upper Trapezius Stretch Right;Left;20 seconds                  PT Education - 11/12/19 1522    Education Details Pt overpressure was added to cervical rotation exs    Person(s) Educated Patient    Methods Explanation;Demonstration;Tactile cues;Verbal cues;Handout    Comprehension Verbalized understanding;Returned demonstration;Verbal cues required;Tactile cues required            PT Short Term Goals - 11/12/19 1532      PT SHORT TERM GOAL #1   Title Pt will be ind in an initial HEP. Achieved    Target Date 11/11/19      PT SHORT TERM GOAL #2   Title Pt will voice understanding of measures to reduce and management  pain. Achieved    Target Date 11/11/19      PT SHORT TERM GOAL #3   Title improve cervical ROM by 50% of current values. On going-Improved from initial eval    Baseline See flowsheets    Target Date 11/11/19             PT Long Term Goals - 09/29/19 2134      PT LONG TERM GOAL #1   Title Improve cervical ROM by 90% of initial values    Baseline See flowsheets    Time 7    Period Weeks    Status New    Target Date 11/17/19      PT LONG TERM GOAL #2   Title Pt wil report a pain range of 0-4/10 with daily activities    Baseline 7/10    Time 7    Period Weeks    Status New    Target Date 11/17/19      PT LONG TERM GOAL #3   Title Pt's FOTO score will improve to 46% limitation    Baseline 65% Limitation    Time 7    Period Weeks    Status New    Target Date 11/17/19      PT LONG TERM GOAL #4   Title Pt will be ind in a final HEP    Time 7    Period Weeks    Status New    Target Date 11/17/19                 Plan - 11/12/19 1523    Clinical Impression Statement Reassessment of  cervical AROM found neck movements to be min to mod improved vs initial eval, but still moderately limited in motion. Following cervical traction, STW to the upper shoulders and neck and pt completing cervical rotation c self applied overpfressure, rotation ROM was assessed again with additional improvement. FOTO score improved to 45% Limitation, exceeding predicted value. Pt will benefit from continued pt to address neck pain and ROM to improve functional use of his neck.    Personal Factors and Comorbidities Comorbidity 1    Comorbidities DM    Examination-Activity Limitations Carry;Caring for Others;Lift;Stand;Sleep;Sit;Reach Overhead;Other    Examination-Participation Restrictions Driving;Yard Work    Conservation officer, historic buildings Evolving/Moderate complexity    Clinical Decision Making Moderate    Rehab Potential Good    PT Frequency 2x / week    PT Duration 6 weeks    PT Treatment/Interventions Cryotherapy;Electrical Stimulation;Ultrasound;Traction;Moist Heat;Iontophoresis 4mg /ml Dexamethasone;Functional mobility training;Therapeutic activities;Therapeutic exercise;Manual techniques;Patient/family education;Passive range of motion;Dry needling;Taping    PT Next Visit Plan Reassess to determine continuation of PT vs. DC    PT Home Exercise Plan : retraction in sitting, cervical retraction in supine, cervical rotation c gentle overpressure, row, ext           Patient will benefit from skilled therapeutic intervention in order to improve the following deficits and impairments:  Decreased activity tolerance, Decreased mobility, Decreased strength, Postural dysfunction, Pain, Increased muscle spasms, Decreased range of motion, Hypomobility, Difficulty walking  Visit Diagnosis: Acute neck pain  Acute midline thoracic back pain  Muscle spasms of neck  Decreased ROM of neck     Problem List Patient Active Problem List   Diagnosis Date Noted  . Type 2 diabetes mellitus  without complication, without long-term current use of insulin (HCC) 04/05/2019  . COVID-19 07/03/2018  . Sepsis (HCC) 07/03/2018  . Shortness of breath 07/03/2018  . Hyponatremia 07/03/2018  .  Fever 07/03/2018  . Rhinitis, allergic 05/22/2015  . Elevated BP 05/22/2015   Joellyn Rued MS, PT 11/12/19 3:38 PM  [facility] Outpatient Rehabilitation [facility] [address] [place], [place], [address] Phone: [phone]   Fax:  [phone]  Name: Javier [name] MRN: [id number] Date of Birth: 1970/04/29

## 2019-11-17 ENCOUNTER — Ambulatory Visit: Payer: 59 | Admitting: Physical Therapy

## 2019-11-17 ENCOUNTER — Other Ambulatory Visit: Payer: Self-pay

## 2019-11-17 ENCOUNTER — Encounter: Payer: Self-pay | Admitting: Physical Therapy

## 2019-11-17 DIAGNOSIS — M62838 Other muscle spasm: Secondary | ICD-10-CM

## 2019-11-17 DIAGNOSIS — M542 Cervicalgia: Secondary | ICD-10-CM | POA: Diagnosis not present

## 2019-11-17 DIAGNOSIS — R29898 Other symptoms and signs involving the musculoskeletal system: Secondary | ICD-10-CM

## 2019-11-17 DIAGNOSIS — M546 Pain in thoracic spine: Secondary | ICD-10-CM
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
## 2019-11-19 ENCOUNTER — Encounter: Payer: Self-pay | Admitting: Registered Nurse

## 2019-11-19 ENCOUNTER — Other Ambulatory Visit: Payer: Self-pay

## 2019-11-19 ENCOUNTER — Ambulatory Visit: Payer: 59 | Admitting: Registered Nurse

## 2019-11-19 VITALS — BP 123/81 | HR 67 | Temp 98.2°F | Resp 18 | Ht 66.0 in | Wt 157.8 lb

## 2019-11-19 DIAGNOSIS — N529 Male erectile dysfunction, unspecified: Secondary | ICD-10-CM

## 2019-11-19 DIAGNOSIS — R350 Frequency of micturition: Secondary | ICD-10-CM

## 2019-11-19 DIAGNOSIS — J3089 Other allergic rhinitis: Secondary | ICD-10-CM | POA: Diagnosis not present

## 2019-11-19 DIAGNOSIS — Z1329 Encounter for screening for other suspected endocrine disorder: Secondary | ICD-10-CM

## 2019-11-19 DIAGNOSIS — R252 Cramp and spasm: Secondary | ICD-10-CM | POA: Diagnosis not present

## 2019-11-19 DIAGNOSIS — Z1211 Encounter for screening for malignant neoplasm of colon: Secondary | ICD-10-CM

## 2019-11-19 LAB — POCT URINALYSIS DIP (CLINITEK)
Bilirubin, UA: NEGATIVE
Glucose, UA: NEGATIVE mg/dL
Ketones, POC UA: NEGATIVE mg/dL
Leukocytes, UA: NEGATIVE
Nitrite, UA: NEGATIVE
POC PROTEIN,UA: NEGATIVE
Spec Grav, UA: 1.025 (ref 1.010–1.025)
Urobilinogen, UA: 1 E.U./dL
pH, UA: 6.5 (ref 5.0–8.0)

## 2019-11-19 LAB — POCT GLYCOSYLATED HEMOGLOBIN (HGB A1C): Hemoglobin A1C: 5.7 % — AB (ref 4.0–5.6)

## 2019-11-19 MED ORDER — LEVOCETIRIZINE DIHYDROCHLORIDE 5 MG PO TABS: 5.0000 mg | ORAL_TABLET | Freq: Every evening | ORAL | 3 refills | Status: DC

## 2019-11-19 MED ORDER — SILDENAFIL CITRATE 50 MG PO TABS: 25.0000 mg | ORAL_TABLET | Freq: Every day | ORAL | 3 refills | Status: DC | PRN

## 2019-11-19 NOTE — Patient Instructions (Signed)
° ° ° °  If you have lab work done today you will be contacted with your lab results within the next 2 weeks.  If you have not heard from us then please contact us. The fastest way to get your results is to register for My Chart. ° ° °IF you received an x-ray today, you will receive an invoice from [facility]. Please contact [facility] at [phone] with questions or concerns regarding your invoice.  ° °IF you received labwork today, you will receive an invoice from [facility]. Please contact [facility] at [phone] with questions or concerns regarding your invoice.  ° °Our billing staff will not be able to assist you with questions regarding bills from these companies. ° °You will be contacted with the lab results as soon as they are available. The fastest way to get your results is to activate your My Chart account. Instructions are located on the last page of this paperwork. If you have not heard from us regarding the results in 2 weeks, please contact this office. °  ° ° ° °
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
## 2019-11-22 ENCOUNTER — Other Ambulatory Visit: Payer: Self-pay | Admitting: Registered Nurse

## 2019-11-22 DIAGNOSIS — N529 Male erectile dysfunction, unspecified: Secondary | ICD-10-CM

## 2019-11-22 MED ORDER — SILDENAFIL CITRATE 50 MG PO TABS: 25.0000 mg | ORAL_TABLET | Freq: Every day | ORAL | 3 refills | Status: DC | PRN

## 2019-11-22 NOTE — Telephone Encounter (Signed)
Medication Refill - Medication: sildenafil (VIAGRA) 50 MG tablet (Patient needs medication sent to pharmacy below because the pharmacy where medication was initially sent to is charging patient  $500 for prescription. Patient requesting a callback from nurse once medication has been sent to new pharmacy.)   Has the patient contacted their pharmacy? yes (Agent: If no, request that the patient contact the pharmacy for the refill.) (Agent: If yes, when and what did the pharmacy advise?)Contact PCP  Preferred Pharmacy (with phone number or street name):  [facility] [address] [place] - [address] Phone:  [phone]  Fax:  [phone]       Agent: Please be advised that RX refills may take up to 3 business days. We ask that you follow-up with your pharmacy.
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
## 2019-11-23 ENCOUNTER — Encounter: Payer: Self-pay | Admitting: Registered Nurse

## 2019-11-23 LAB — CBC WITH DIFFERENTIAL
Hematocrit: 50.2 % (ref 37.5–51.0)
Hemoglobin: 16.3 g/dL (ref 13.0–17.7)
MCH: 32.1 pg (ref 26.6–33.0)
MCHC: 32.5 g/dL (ref 31.5–35.7)
MCV: 99 fL — ABNORMAL HIGH (ref 79–97)
RBC: 5.08 x10E6/uL (ref 4.14–5.80)
RDW: 11.9 % (ref 11.6–15.4)
WBC: 4.9 10*3/uL (ref 3.4–10.8)

## 2019-11-23 LAB — COMPREHENSIVE METABOLIC PANEL
ALT: 15 IU/L (ref 0–44)
AST: 16 IU/L (ref 0–40)
Albumin/Globulin Ratio: 1.7 (ref 1.2–2.2)
Albumin: 4.8 g/dL (ref 4.0–5.0)
Alkaline Phosphatase: 43 IU/L — ABNORMAL LOW (ref 48–121)
BUN/Creatinine Ratio: 21 — ABNORMAL HIGH (ref 9–20)
BUN: 18 mg/dL (ref 6–24)
Bilirubin Total: 0.4 mg/dL (ref 0.0–1.2)
CO2: 20 mmol/L (ref 20–29)
Calcium: 9.1 mg/dL (ref 8.7–10.2)
Chloride: 102 mmol/L (ref 96–106)
Creatinine, Ser: 0.87 mg/dL (ref 0.76–1.27)
GFR calc Af Amer: 116 mL/min/{1.73_m2} (ref >59)
GFR calc non Af Amer: 101 mL/min/{1.73_m2} (ref >59)
Globulin, Total: 2.9 g/dL (ref 1.5–4.5)
Glucose: 91 mg/dL (ref 65–99)
Potassium: 4.3 mmol/L (ref 3.5–5.2)
Sodium: 139 mmol/L (ref 134–144)
Total Protein: 7.7 g/dL (ref 6.0–8.5)

## 2019-11-23 LAB — TSH: TSH: 1.96 u[IU]/mL (ref 0.450–4.500)

## 2019-11-23 LAB — MICROALBUMIN / CREATININE URINE RATIO
Creatinine, Urine: 63.1 mg/dL
Microalb/Creat Ratio: <5 mg/g creat (ref 0–29)
Microalbumin, Urine: <3 ug/mL

## 2019-11-23 LAB — MAGNESIUM: Magnesium: 2.2 mg/dL (ref 1.6–2.3)

## 2019-11-24 NOTE — Telephone Encounter (Signed)
Patient would like to know why some of her labs were cancelled I know one was for a urine. Is it possible that she just didn't leave A urine sample?

## 2019-11-25 ENCOUNTER — Telehealth: Payer: Self-pay | Admitting: *Deleted

## 2019-11-25 ENCOUNTER — Encounter: Payer: Self-pay | Admitting: *Deleted

## 2019-11-25 NOTE — Telephone Encounter (Signed)
Medication Sildenafil has been approved 11-24-2019-11-23-2020  Patient informed

## 2019-11-27 ENCOUNTER — Encounter: Payer: Self-pay | Admitting: Registered Nurse

## 2019-11-27 NOTE — Progress Notes (Signed)
Acute Office Visit  Subjective:    Patient ID: Javier [name], male    DOB: [birth year], 49 y.o.   MRN: [id number]  Chief Complaint  Patient presents with  . Back Pain    Patient states he has been having lower back pain starting last night followed by cramping in the right leg , and also noticed he has been urinating frequently. Per patient he has not taken anyting for pain    HPI Patient is in today for lower back pain. R side Onset 1 day ago. Comes in waves. Pain feels like it extends into upper right leg. No sciatic type pain running down whole leg. Feels tight, like muscle spasm Has been urinating more frequently but notes that he has been drinking a lot of water. No pain with urination or discharge.  No acute injury that he can think of. No saddle symptoms  Past Medical History:  Diagnosis Date  . Diabetes mellitus without complication (HCC)   . No pertinent past medical history     Past Surgical History:  Procedure Laterality Date  . NO PAST SURGERIES      Family History  Problem Relation Age of Onset  . Hypertension Neg Hx   . Heart disease Neg Hx   . Cancer Neg Hx   . AAA (abdominal aortic aneurysm) Neg Hx     Social History   Socioeconomic History  . Marital status: Married    Spouse name: Not on file  . Number of children: 3  . Years of education: Not on file  . Highest education level: Not on file  Occupational History  . Not on file  Tobacco Use  . Smoking status: Never Smoker  . Smokeless tobacco: Never Used  Vaping Use  . Vaping Use: Never used  Substance and Sexual Activity  . Alcohol use: Yes    Comment: socially  . Drug use: Never  . Sexual activity: Not on file  Other Topics Concern  . Not on file  Social History Narrative   Married with 3 children   Works Financial trader   Social Determinants of Health   Financial Resource Strain:   . Difficulty of Paying Living Expenses: Not on file  Food Insecurity:   . Worried  About Programme researcher, broadcasting/film/video in the Last Year: Not on file  . Ran Out of Food in the Last Year: Not on file  Transportation Needs:   . Lack of Transportation (Medical): Not on file  . Lack of Transportation (Non-Medical): Not on file  Physical Activity:   . Days of Exercise per Week: Not on file  . Minutes of Exercise per Session: Not on file  Stress:   . Feeling of Stress : Not on file  Social Connections:   . Frequency of Communication with Friends and Family: Not on file  . Frequency of Social Gatherings with Friends and Family: Not on file  . Attends Religious Services: Not on file  . Active Member of Clubs or Organizations: Not on file  . Attends Banker Meetings: Not on file  . Marital Status: Not on file  Intimate Partner Violence:   . Fear of Current or Ex-Partner: Not on file  . Emotionally Abused: Not on file  . Physically Abused: Not on file  . Sexually Abused: Not on file    Outpatient Medications Prior to Visit  Medication Sig Dispense Refill  . tadalafil (CIALIS) 20 MG tablet Take 0.5-1 tablets (10-20 mg total)  by mouth every other day as needed for erectile dysfunction. 30 tablet 3  . acetaminophen (TYLENOL) 325 MG tablet Take 2 tablets (650 mg total) by mouth every 6 (six) hours as needed for mild pain or fever (or Fever >/= 101). (Patient not taking: Reported on 06/23/2019)    . albuterol (VENTOLIN HFA) 108 (90 Base) MCG/ACT inhaler Inhale 1-2 puffs into the lungs every 6 (six) hours as needed for wheezing. (Patient not taking: Reported on 04/05/2019) 1 Inhaler 0  . azithromycin (ZITHROMAX) 500 MG tablet Take 1 tablet (500 mg total) by mouth daily. (Patient not taking: Reported on 04/05/2019) 3 tablet 0  . hydrOXYzine (ATARAX/VISTARIL) 25 MG tablet Take 2 tablets (50 mg total) by mouth at bedtime. As needed for sleep (Patient not taking: Reported on 04/05/2019) 14 tablet 0  . Melatonin 5 MG TABS Take 1 tablet (5 mg total) by mouth at bedtime as needed. (Patient  not taking: Reported on 04/05/2019) 15 tablet 0  . metFORMIN (GLUCOPHAGE) 500 MG tablet Take 500 mg by mouth 2 (two) times daily with a meal. (Patient not taking: Reported on 09/10/2019)    . vitamin C (VITAMIN C) 500 MG tablet Take 1 tablet (500 mg total) by mouth daily. Please take for 2 weeks (Patient not taking: Reported on 04/05/2019)    . zinc sulfate 220 (50 Zn) MG capsule Take 1 capsule (220 mg total) by mouth daily. Please take for 2 days (Patient not taking: Reported on 06/23/2019)     No facility-administered medications prior to visit.    No Known Allergies  Review of Systems  Constitutional: Negative.   HENT: Negative.   Eyes: Negative.   Respiratory: Negative.   Cardiovascular: Negative.   Gastrointestinal: Negative.   Genitourinary: Negative.   Musculoskeletal: Positive for back pain. Negative for arthralgias, gait problem, joint swelling, myalgias, neck pain and neck stiffness.  Skin: Negative.   Neurological: Negative.   Psychiatric/Behavioral: Negative.        Objective:    Physical Exam Vitals and nursing note reviewed.  Constitutional:      General: He is not in acute distress.    Appearance: Normal appearance. He is normal weight. He is not ill-appearing, toxic-appearing or diaphoretic.  Cardiovascular:     Rate and Rhythm: Normal rate and regular rhythm.     Pulses: Normal pulses.     Heart sounds: Normal heart sounds.  Pulmonary:     Effort: Pulmonary effort is normal. No respiratory distress.     Breath sounds: Normal breath sounds.  Musculoskeletal:        General: Tenderness (lumbar paraspinal R side) present. No swelling, deformity or signs of injury. Normal range of motion.     Right lower leg: No edema.     Left lower leg: No edema.  Neurological:     General: No focal deficit present.     Mental Status: He is alert and oriented to person, place, and time. Mental status is at baseline.     Cranial Nerves: No cranial nerve deficit.  Psychiatric:         Mood and Affect: Mood normal.        Behavior: Behavior normal.        Thought Content: Thought content normal.        Judgment: Judgment normal.     BP (!) 133/91   Pulse 76   Temp 97.8 F (36.6 C) (Temporal)   Resp 16   Ht 5\' 6"  (1.676 m)  Wt 152 lb 12.8 oz (69.3 kg)   SpO2 94%   BMI 24.66 kg/m  Wt Readings from Last 3 Encounters:  11/19/19 157 lb 12.8 oz (71.6 kg)  09/10/19 155 lb 12.8 oz (70.7 kg)  07/20/19 152 lb 12.8 oz (69.3 kg)    Health Maintenance Due  Topic Date Due  . FOOT EXAM  Never done  . OPHTHALMOLOGY EXAM  Never done  . COVID-19 Vaccine (1) Never done  . INFLUENZA VACCINE  10/10/2019    There are no preventive care reminders to display for this patient.   Lab Results  Component Value Date   TSH 1.960 11/19/2019   Lab Results  Component Value Date   WBC 4.9 11/19/2019   HGB 16.3 11/19/2019   HCT 50.2 11/19/2019   MCV 99 (H) 11/19/2019   PLT 220 04/05/2019   Lab Results  Component Value Date   NA 139 11/19/2019   K 4.3 11/19/2019   CO2 20 11/19/2019   GLUCOSE 91 11/19/2019   BUN 18 11/19/2019   CREATININE 0.87 11/19/2019   BILITOT 0.4 11/19/2019   ALKPHOS 43 (L) 11/19/2019   AST 16 11/19/2019   ALT 15 11/19/2019   PROT 7.7 11/19/2019   ALBUMIN 4.8 11/19/2019   CALCIUM 9.1 11/19/2019   ANIONGAP 10 07/07/2018   Lab Results  Component Value Date   CHOL 166 04/05/2019   Lab Results  Component Value Date   HDL 52 04/05/2019   Lab Results  Component Value Date   LDLCALC 104 (H) 04/05/2019   Lab Results  Component Value Date   TRIG 51 04/05/2019   Lab Results  Component Value Date   CHOLHDL 3.2 04/05/2019   Lab Results  Component Value Date   HGBA1C 5.7 (A) 11/19/2019       Assessment & Plan:   Problem List Items Addressed This Visit    None    Visit Diagnoses    Flank pain    -  Primary   Relevant Orders   POCT URINALYSIS DIP (CLINITEK) (Completed)   Leg cramping       Relevant Orders   Vitamin D,  25-hydroxy (Completed)   Vitamin B12 (Completed)   Magnesium (Completed)   Lumbar paraspinal muscle spasm       Relevant Medications   predniSONE (STERAPRED UNI-PAK 21 TAB) 10 MG (21) TBPK tablet       Meds ordered this encounter  Medications  . predniSONE (STERAPRED UNI-PAK 21 TAB) 10 MG (21) TBPK tablet    Sig: Take per package instructions. Do not skip doses. Finish entire supply.    Dispense:  1 each    Refill:  0    Order Specific Question:   Supervising Provider    Answer:   Collie Siad A [id number]  . DISCONTD: methocarbamol (ROBAXIN) 500 MG tablet    Sig: Take 1 tablet (500 mg total) by mouth 2 (two) times daily as needed for muscle spasms.    Dispense:  40 tablet    Refill:  0    Order Specific Question:   Supervising Provider    Answer:   Doristine Bosworth [id number]   PLAN  poct UA shows no evidence of UTI  Will draw vit D and B12   Likely muscle spasm, will give sterapred dose pack and robaxin for use over the next few days  Consider PT referral if pain persists  Patient encouraged to call clinic with any questions, comments, or concerns.   Janeece Agee,  NP

## 2019-12-02 ENCOUNTER — Encounter: Payer: Self-pay | Admitting: Registered Nurse

## 2019-12-02 NOTE — Telephone Encounter (Signed)
We can write the letter for him no problem I can sign it tomorrow if you wouldn't mind printing it and leaving it in my inbox  Thank you  [name], nP

## 2019-12-22 ENCOUNTER — Ambulatory Visit: Payer: 59 | Admitting: Registered Nurse

## 2020-01-10 ENCOUNTER — Ambulatory Visit: Payer: 59 | Admitting: Registered Nurse

## 2020-01-11 ENCOUNTER — Other Ambulatory Visit: Payer: Self-pay

## 2020-01-11 ENCOUNTER — Encounter: Payer: Self-pay | Admitting: Family Medicine

## 2020-01-11 ENCOUNTER — Ambulatory Visit: Payer: 59 | Admitting: Family Medicine

## 2020-01-11 VITALS — BP 119/80 | HR 72 | Temp 98.1°F | Ht 66.0 in | Wt 157.0 lb

## 2020-01-11 DIAGNOSIS — R0789 Other chest pain: Secondary | ICD-10-CM

## 2020-01-11 NOTE — Patient Instructions (Addendum)
Recommending restarting physical therapy. Use Ibuprofen as needed for pain.  Muscle Pain, Adult Muscle pain (myalgia) may be mild or severe. In most cases, the pain lasts only a short time and it goes away without treatment. It is normal to feel some muscle pain after starting a workout program. Muscles that have not been used often will be sore at first. Muscle pain may also be caused by many other things, including:  Overuse or muscle strain, especially if you are not in shape. This is the most common cause of muscle pain.  Injury.  Bruises.  Viruses, such as the flu.  Infectious diseases.  A chronic condition that causes muscle tenderness, fatigue, and headache (fibromyalgia).  A condition, such as lupus, in which the body's disease-fighting system attacks other organs in the body (autoimmune or rheumatologic diseases).  Certain drugs, including ACE inhibitors and statins. To diagnose the cause of your muscle pain, your health care provider will do a physical exam and ask questions about the pain and when it began. If you have not had muscle pain for very long, your health care provider may want to wait before doing much testing. If your muscle pain has lasted a long time, your health care provider may want to run tests right away. In some cases, this may include tests to rule out certain conditions or illnesses. Treatment for muscle pain depends on the cause. Home care is often enough to relieve muscle pain. Your health care provider may also prescribe anti-inflammatory medicine. Follow these instructions at home: Activity  If overuse is causing your muscle pain: ? Slow down your activities until the pain goes away. ? Do regular, gentle exercises if you are not usually active. ? Warm up before exercising. Stretch before and after exercising. This can help lower the risk of muscle pain.  Do not continue working out if the pain is very bad. Bad pain could mean that you have injured  a muscle. Managing pain and discomfort   If directed, apply ice to the sore muscle: ? Put ice in a plastic bag. ? Place a towel between your skin and the bag. ? Leave the ice on for 20 minutes, 2-3 times a day.  You may also alternate between applying ice and applying heat as told by your health care provider. To apply heat, use the heat source that your health care provider recommends, such as a moist heat pack or a heating pad. ? Place a towel between your skin and the heat source. ? Leave the heat on for 20-30 minutes. ? Remove the heat if your skin turns bright red. This is especially important if you are unable to feel pain, heat, or cold. You may have a greater risk of getting burned. Medicines  Take over-the-counter and prescription medicines only as told by your health care provider.  Do not drive or use heavy machinery while taking prescription pain medicine. Contact a health care provider if:  Your muscle pain gets worse and medicines do not help.  You have muscle pain that lasts longer than 3 days.  You have a rash or fever along with muscle pain.  You have muscle pain after a tick bite.  You have muscle pain while working out, even though you are in good physical condition.  You have redness, soreness, or swelling along with muscle pain.  You have muscle pain after starting a new medicine or changing the dose of a medicine. Get help right away if:  You have  trouble breathing.  You have trouble swallowing.  You have muscle pain along with a stiff neck, fever, and vomiting.  You have severe muscle weakness or cannot move part of your body. This information is not intended to replace advice given to you by your health care provider. Make sure you discuss any questions you have with your health care provider. Document Revised: 02/07/2017 Document Reviewed: 07/18/2015 Elsevier Patient Education  2020 [employer].

## 2020-01-11 NOTE — Progress Notes (Signed)
11/2/20214:45 PM  Javier [name] April 10, 1970, 49 y.o., male [id number]  Chief Complaint  Patient presents with  . mild chest pain and L shoulder blade pain    2 to 3 times per day when turning head to left side x 2 weeks     HPI:   Patient is a 49 y.o. male with past medical history significant for DM who presents today for mild chest pain when turning head.  Pain starts mid upper back and when he rotates his body or head left side rib pain Denies radiation to arm or jaw Denies numbness, tingling Started a week ago, pain is only when provoked at worst 4/10   Depression screen [facility] 2/9 01/11/2020 11/19/2019 09/10/2019  Decreased Interest 0 0 0  Down, Depressed, Hopeless 0 0 0  PHQ - 2 Score 0 0 0  Altered sleeping - - -  Tired, decreased energy - - -  Change in appetite - - -  Feeling bad or failure about yourself  - - -  Trouble concentrating - - -  Moving slowly or fidgety/restless - - -  Suicidal thoughts - - -  PHQ-9 Score - - -  Difficult doing work/chores - - -    Fall Risk  01/11/2020 11/19/2019 09/10/2019 07/20/2019 06/23/2019  Falls in the past year? 0 0 0 0 0  Number falls in past yr: 0 0 0 0 0  Injury with Fall? 0 0 0 0 0  Follow up Falls evaluation completed Falls evaluation completed Falls evaluation completed Falls evaluation completed Falls evaluation completed     No Known Allergies  Prior to Admission medications   Medication Sig Start Date End Date Taking? Authorizing Provider  acetaminophen (TYLENOL) 325 MG tablet Take 2 tablets (650 mg total) by mouth every 6 (six) hours as needed for mild pain or fever (or Fever >/= 101). Patient not taking: Reported on 06/23/2019 07/04/18   Elgergawy, Leana Roe, MD  albuterol (VENTOLIN HFA) 108 (90 Base) MCG/ACT inhaler Inhale 1-2 puffs into the lungs every 6 (six) hours as needed for wheezing. Patient not taking: Reported on 04/05/2019 07/01/18   Garlon Hatchet, PA-C  azithromycin (ZITHROMAX) 500 MG tablet Take 1 tablet (500  mg total) by mouth daily. Patient not taking: Reported on 04/05/2019 07/10/18   Fulp, Hewitt Shorts, MD  gabapentin (NEURONTIN) 100 MG capsule Take 1 capsule (100 mg total) by mouth 3 (three) times daily. 09/10/19   Janeece Agee, NP  hydrOXYzine (ATARAX/VISTARIL) 25 MG tablet Take 2 tablets (50 mg total) by mouth at bedtime. As needed for sleep Patient not taking: Reported on 04/05/2019 07/08/18   Ward, Layla Maw, DO  levocetirizine (XYZAL) 5 MG tablet Take 1 tablet (5 mg total) by mouth every evening. 11/19/19   Janeece Agee, NP  Melatonin 5 MG TABS Take 1 tablet (5 mg total) by mouth at bedtime as needed. Patient not taking: Reported on 04/05/2019 07/08/18   Ward, Layla Maw, DO  metFORMIN (GLUCOPHAGE) 500 MG tablet Take 500 mg by mouth 2 (two) times daily with a meal. Patient not taking: Reported on 09/10/2019    [provider]  methocarbamol (ROBAXIN) 500 MG tablet Take 1 tablet (500 mg total) by mouth 2 (two) times daily as needed for muscle spasms. 09/10/19   Janeece Agee, NP  predniSONE (STERAPRED UNI-PAK 21 TAB) 10 MG (21) TBPK tablet Take per package instructions. Do not skip doses. Finish entire supply. Patient not taking: Reported on 09/10/2019 07/20/19   Janeece Agee, NP  sildenafil (VIAGRA) 50 MG tablet Take 0.5-1 tablets (25-50 mg total) by mouth daily as needed for erectile dysfunction. 11/22/19   Janeece Agee, NP  tadalafil (CIALIS) 20 MG tablet Take 0.5-1 tablets (10-20 mg total) by mouth every other day as needed for erectile dysfunction. 07/02/19   Janeece Agee, NP  vitamin C (VITAMIN C) 500 MG tablet Take 1 tablet (500 mg total) by mouth daily. Please take for 2 weeks Patient not taking: Reported on 04/05/2019 07/05/18   Elgergawy, Leana Roe, MD  zinc sulfate 220 (50 Zn) MG capsule Take 1 capsule (220 mg total) by mouth daily. Please take for 2 days Patient not taking: Reported on 06/23/2019 07/05/18   Elgergawy, Leana Roe, MD    Past Medical History:  Diagnosis Date  . Diabetes  mellitus without complication (HCC)   . No pertinent past medical history     Past Surgical History:  Procedure Laterality Date  . NO PAST SURGERIES      Social History   Tobacco Use  . Smoking status: Never Smoker  . Smokeless tobacco: Never Used  Substance Use Topics  . Alcohol use: Yes    Comment: socially    Family History  Problem Relation Age of Onset  . Hypertension Neg Hx   . Heart disease Neg Hx   . Cancer Neg Hx   . AAA (abdominal aortic aneurysm) Neg Hx     Review of Systems  Constitutional: Negative for chills, fever and malaise/fatigue.  Eyes: Negative for blurred vision and double vision.  Respiratory: Negative for cough, shortness of breath and wheezing.   Cardiovascular: Positive for chest pain. Negative for palpitations and leg swelling.  Gastrointestinal: Negative for abdominal pain, blood in stool, constipation, diarrhea, heartburn, nausea and vomiting.  Genitourinary: Negative for dysuria, frequency and hematuria.  Musculoskeletal: Positive for back pain and neck pain. Negative for joint pain and myalgias.  Skin: Negative for rash.  Neurological: Negative for dizziness, sensory change, focal weakness, weakness and headaches.     OBJECTIVE:  Today's Vitals   01/11/20 1505  BP: 119/80  Pulse: 72  Temp: 98.1 F (36.7 C)  SpO2: 96%  Weight: 157 lb (71.2 kg)  Height: 5\' 6"  (1.676 m)   Body mass index is 25.34 kg/m.   Physical Exam Vitals reviewed.  Constitutional:      Appearance: Normal appearance.  HENT:     Head: Normocephalic and atraumatic.  Eyes:     Conjunctiva/sclera: Conjunctivae normal.     Pupils: Pupils are equal, round, and reactive to light.  Cardiovascular:     Rate and Rhythm: Normal rate and regular rhythm.     Pulses: Normal pulses.     Heart sounds: Normal heart sounds. No murmur heard.  No friction rub. No gallop.   Pulmonary:     Effort: Pulmonary effort is normal. No respiratory distress.     Breath sounds:  Normal breath sounds. No stridor. No wheezing or rales.  Chest:     Chest wall: No deformity or tenderness.  Abdominal:     General: Bowel sounds are normal.     Palpations: Abdomen is soft.     Tenderness: There is no abdominal tenderness.  Musculoskeletal:     Cervical back: Normal and normal range of motion. No tenderness.     Thoracic back: Normal.     Lumbar back: Normal.     Right lower leg: No edema.     Left lower leg: No edema.  Skin:  General: Skin is warm and dry.  Neurological:     General: No focal deficit present.     Mental Status: He is alert and oriented to person, place, and time.  Psychiatric:        Mood and Affect: Mood normal.        Behavior: Behavior normal.     No results found for this or any previous visit (from the past 24 hour(s)).  No results found.   ASSESSMENT and PLAN  Problem List Items Addressed This Visit    None    Visit Diagnoses    Other chest pain    -  Primary   Relevant Orders   EKG 12-Lead: compared to previous EKG on file NSR, no ectopy noted  This pain seems musculoskeletal in nature Easily reproducible. He did not want a referral to PT, he will call who he previously saw for PT. Encouraged to use NSAIDs as needed for pain relief. RTC/ ED precautions given to patient.     Return if symptoms worsen or fail to improve or at next scheduled visit.Javier Carls Mckenze Slone, FNP-BC Primary Care at [facility] [address] [place], [place] [address] Ph.  [phone] Fax [phone]

## 2020-03-17 ENCOUNTER — Other Ambulatory Visit: Payer: Self-pay

## 2020-03-17 ENCOUNTER — Encounter: Payer: Self-pay | Admitting: Registered Nurse

## 2020-03-17 ENCOUNTER — Ambulatory Visit (INDEPENDENT_AMBULATORY_CARE_PROVIDER_SITE_OTHER): Payer: 59 | Admitting: Registered Nurse

## 2020-03-17 VITALS — BP 117/78 | HR 71 | Temp 98.1°F | Ht 65.0 in | Wt 159.0 lb

## 2020-03-17 DIAGNOSIS — Z8639 Personal history of other endocrine, nutritional and metabolic disease: Secondary | ICD-10-CM

## 2020-03-17 DIAGNOSIS — Z87448 Personal history of other diseases of urinary system: Secondary | ICD-10-CM | POA: Diagnosis not present

## 2020-03-17 LAB — POCT URINALYSIS DIP (CLINITEK)
Bilirubin, UA: NEGATIVE
Blood, UA: NEGATIVE
Glucose, UA: NEGATIVE mg/dL
Ketones, POC UA: NEGATIVE mg/dL
Leukocytes, UA: NEGATIVE
Nitrite, UA: NEGATIVE
POC PROTEIN,UA: NEGATIVE
Spec Grav, UA: 1.015 (ref 1.010–1.025)
Urobilinogen, UA: 0.2 E.U./dL
pH, UA: 5.5 (ref 5.0–8.0)

## 2020-03-17 NOTE — Patient Instructions (Signed)
° ° ° °  If you have lab work done today you will be contacted with your lab results within the next 2 weeks.  If you have not heard from us then please contact us. The fastest way to get your results is to register for My Chart. ° ° °IF you received an x-ray today, you will receive an invoice from [facility]. Please contact  Radiology at [phone] with questions or concerns regarding your invoice.  ° °IF you received labwork today, you will receive an invoice from [facility]. Please contact [facility] at [phone] with questions or concerns regarding your invoice.  ° °Our billing staff will not be able to assist you with questions regarding bills from these companies. ° °You will be contacted with the lab results as soon as they are available. The fastest way to get your results is to activate your My Chart account. Instructions are located on the last page of this paperwork. If you have not heard from us regarding the results in 2 weeks, please contact this office. °  ° ° ° °

## 2020-03-18 LAB — CBC WITH DIFFERENTIAL
Basophils Absolute: 0.1 10*3/uL (ref 0.0–0.2)
Basos: 1 %
EOS (ABSOLUTE): 0.1 10*3/uL (ref 0.0–0.4)
Eos: 2 %
Hematocrit: 49.5 % (ref 37.5–51.0)
Hemoglobin: 17 g/dL (ref 13.0–17.7)
Immature Grans (Abs): 0 10*3/uL (ref 0.0–0.1)
Immature Granulocytes: 0 %
Lymphocytes Absolute: 2.1 10*3/uL (ref 0.7–3.1)
Lymphs: 46 %
MCH: 32.2 pg (ref 26.6–33.0)
MCHC: 34.3 g/dL (ref 31.5–35.7)
MCV: 94 fL (ref 79–97)
Monocytes Absolute: 0.5 10*3/uL (ref 0.1–0.9)
Monocytes: 10 %
Neutrophils Absolute: 1.9 10*3/uL (ref 1.4–7.0)
Neutrophils: 41 %
RBC: 5.28 x10E6/uL (ref 4.14–5.80)
RDW: 12.1 % (ref 11.6–15.4)
WBC: 4.6 10*3/uL (ref 3.4–10.8)

## 2020-03-18 LAB — MICROALBUMIN / CREATININE URINE RATIO
Creatinine, Urine: 45.7 mg/dL
Microalb/Creat Ratio: <7 mg/g creat (ref 0–29)
Microalbumin, Urine: <3 ug/mL

## 2020-03-18 LAB — HEMOGLOBIN A1C
Est. average glucose Bld gHb Est-mCnc: 108 mg/dL
Hgb A1c MFr Bld: 5.4 % (ref 4.8–5.6)

## 2020-03-18 LAB — COMPREHENSIVE METABOLIC PANEL
ALT: 15 IU/L (ref 0–44)
AST: 17 IU/L (ref 0–40)
Albumin/Globulin Ratio: 1.4 (ref 1.2–2.2)
Albumin: 4.7 g/dL (ref 3.8–4.9)
Alkaline Phosphatase: 45 IU/L (ref 44–121)
BUN/Creatinine Ratio: 11 (ref 9–20)
BUN: 11 mg/dL (ref 6–24)
Bilirubin Total: 0.8 mg/dL (ref 0.0–1.2)
CO2: 25 mmol/L (ref 20–29)
Calcium: 9.5 mg/dL (ref 8.7–10.2)
Chloride: 104 mmol/L (ref 96–106)
Creatinine, Ser: 0.99 mg/dL (ref 0.76–1.27)
GFR calc Af Amer: 102 mL/min/{1.73_m2} (ref >59)
GFR calc non Af Amer: 88 mL/min/{1.73_m2} (ref >59)
Globulin, Total: 3.4 g/dL (ref 1.5–4.5)
Glucose: 91 mg/dL (ref 65–99)
Potassium: 4.4 mmol/L (ref 3.5–5.2)
Sodium: 141 mmol/L (ref 134–144)
Total Protein: 8.1 g/dL (ref 6.0–8.5)

## 2020-05-10 ENCOUNTER — Encounter: Payer: Self-pay | Admitting: Registered Nurse

## 2020-05-10 ENCOUNTER — Ambulatory Visit (INDEPENDENT_AMBULATORY_CARE_PROVIDER_SITE_OTHER): Payer: 59 | Admitting: Registered Nurse

## 2020-05-10 ENCOUNTER — Other Ambulatory Visit: Payer: Self-pay

## 2020-05-10 DIAGNOSIS — M7918 Myalgia, other site: Secondary | ICD-10-CM

## 2020-05-10 DIAGNOSIS — M25532 Pain in left wrist: Secondary | ICD-10-CM | POA: Diagnosis not present

## 2020-05-10 MED ORDER — METHYLPREDNISOLONE ACETATE 80 MG/ML IJ SUSP
80.0000 mg | Freq: Once | INTRAMUSCULAR | Status: AC
  Administered 2020-05-10: 80 mg via INTRAMUSCULAR

## 2020-05-10 MED ORDER — DICLOFENAC SODIUM 75 MG PO TBEC: 75.0000 mg | DELAYED_RELEASE_TABLET | Freq: Two times a day (BID) | ORAL | 0 refills | Status: DC

## 2020-05-10 MED ORDER — CYCLOBENZAPRINE HCL 5 MG PO TABS: 5.0000 mg | ORAL_TABLET | Freq: Three times a day (TID) | ORAL | 1 refills | Status: DC | PRN

## 2020-05-10 NOTE — Patient Instructions (Signed)
° ° ° °  If you have lab work done today you will be contacted with your lab results within the next 2 weeks.  If you have not heard from us then please contact us. The fastest way to get your results is to register for My Chart. ° ° °IF you received an x-ray today, you will receive an invoice from [facility]. Please contact [facility] at [phone] with questions or concerns regarding your invoice.  ° °IF you received labwork today, you will receive an invoice from [facility]. Please contact [facility] at [phone] with questions or concerns regarding your invoice.  ° °Our billing staff will not be able to assist you with questions regarding bills from these companies. ° °You will be contacted with the lab results as soon as they are available. The fastest way to get your results is to activate your My Chart account. Instructions are located on the last page of this paperwork. If you have not heard from us regarding the results in 2 weeks, please contact this office. °  ° ° ° °

## 2020-05-15 ENCOUNTER — Ambulatory Visit: Payer: 59 | Admitting: Registered Nurse

## 2020-05-16 ENCOUNTER — Telehealth: Payer: Self-pay | Admitting: Registered Nurse

## 2020-05-16 NOTE — Telephone Encounter (Signed)
Pt asking for referral to hand doctor

## 2020-05-16 NOTE — Telephone Encounter (Signed)
Patient is calling trying to follow up on a status of referral for hand doctor / patient states he is in a lot of pain    Please advise

## 2020-05-17 ENCOUNTER — Other Ambulatory Visit: Payer: Self-pay | Admitting: Registered Nurse

## 2020-05-17 ENCOUNTER — Encounter: Payer: Self-pay | Admitting: Registered Nurse

## 2020-05-17 DIAGNOSIS — M25539 Pain in unspecified wrist: Secondary | ICD-10-CM
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
## 2020-05-17 NOTE — Telephone Encounter (Signed)
Placed,   Thanks,  Luan Pulling

## 2020-05-19 ENCOUNTER — Ambulatory Visit: Payer: 59 | Admitting: Registered Nurse

## 2020-05-30 ENCOUNTER — Ambulatory Visit (INDEPENDENT_AMBULATORY_CARE_PROVIDER_SITE_OTHER): Payer: 59

## 2020-05-30 ENCOUNTER — Encounter: Payer: Self-pay | Admitting: Orthopaedic Surgery

## 2020-05-30 ENCOUNTER — Ambulatory Visit (INDEPENDENT_AMBULATORY_CARE_PROVIDER_SITE_OTHER): Payer: 59 | Admitting: Orthopaedic Surgery

## 2020-05-30 DIAGNOSIS — M25532 Pain in left wrist: Secondary | ICD-10-CM | POA: Diagnosis not present

## 2020-05-30 MED ORDER — MELOXICAM 7.5 MG PO TABS: 7.5000 mg | ORAL_TABLET | Freq: Two times a day (BID) | ORAL | 2 refills | Status: DC | PRN
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
## 2020-05-31 ENCOUNTER — Other Ambulatory Visit: Payer: Self-pay

## 2020-05-31 DIAGNOSIS — M25532 Pain in left wrist: Secondary | ICD-10-CM

## 2020-06-17 ENCOUNTER — Ambulatory Visit
Admission: RE | Admit: 2020-06-17 | Discharge: 2020-06-17 | Disposition: A | Discharge status: Home / Self Care | Payer: 59 | Source: Ambulatory Visit | Attending: Orthopaedic Surgery | Admitting: Orthopaedic Surgery

## 2020-06-17 DIAGNOSIS — M25532 Pain in left wrist: Secondary | ICD-10-CM

## 2020-06-20 ENCOUNTER — Ambulatory Visit (INDEPENDENT_AMBULATORY_CARE_PROVIDER_SITE_OTHER): Payer: 59 | Admitting: Orthopaedic Surgery

## 2020-06-20 DIAGNOSIS — M25532 Pain in left wrist: Secondary | ICD-10-CM

## 2020-06-20 MED ORDER — METHYLPREDNISOLONE ACETATE 40 MG/ML IJ SUSP
13.3300 mg | INTRAMUSCULAR | Status: AC | PRN
  Administered 2020-06-20: 13.33 mg via INTRA_ARTICULAR

## 2020-06-20 MED ORDER — LIDOCAINE HCL 1 % IJ SOLN
0.5000 mL | INTRAMUSCULAR | Status: AC | PRN
  Administered 2020-06-20: .5 mL

## 2020-06-20 MED ORDER — BUPIVACAINE HCL 0.25 % IJ SOLN
0.6600 mL | INTRAMUSCULAR | Status: AC | PRN
  Administered 2020-06-20: .66 mL via INTRA_ARTICULAR
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
## 2020-07-18 ENCOUNTER — Ambulatory Visit (INDEPENDENT_AMBULATORY_CARE_PROVIDER_SITE_OTHER): Payer: 59 | Admitting: Orthopaedic Surgery

## 2020-07-18 DIAGNOSIS — M25531 Pain in right wrist: Secondary | ICD-10-CM | POA: Diagnosis not present

## 2020-07-18 NOTE — Progress Notes (Signed)
   Office Visit Note   Patient: Javier [name]           Date of Birth: 04-04-1970           MRN: [id number] Visit Date: 07/18/2020              Requested by: Janeece Agee, NP [address] [place],  [place] [address] PCP: Janeece Agee, NP   Assessment & Plan: Visit Diagnoses:  1. Pain in right wrist     Plan: Impression is left wrist radiocarpal radial ulnar joint osteoarthritis.  Patient did get a fair amount of relief from cortisone injection.  He still has some pain and stiffness so we will refer him to hand therapy at this time.  I have also provided him with Pennsaid samples as well as a Voltaren gel handout.  He will follow-up with [place] in the next 4 to 6 weeks if his symptoms have not continued to improve.  Follow-Up Instructions: Return if symptoms worsen or fail to improve.   Orders:  Orders Placed This Encounter  Procedures  . Ambulatory referral to Physical Therapy   No orders of the defined types were placed in this encounter.     Procedures: No procedures performed   Clinical Data: No additional findings.   Subjective: Chief Complaint  Patient presents with  . Left Wrist - Follow-up    HPI patient is a pleasant 50 year old gentleman who comes in today for follow-up of his left wrist.  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  Symptoms were consistent with this and left wrist cortisone injection was performed.  He has had much improvement of symptoms following the injection.  He still notes some discomfort with praying.  Review of Systems as detailed in HPI.  All others reviewed and are negative.   Objective: Vital Signs: There were no vitals taken for this visit.  Physical Exam well-developed well-nourished gentleman in no acute distress.  Alert and oriented x3.  Ortho Exam left wrist exam shows mild swelling.  He does have some diffuse tenderness.   He has increased pain with the extremes of wrist extension.  Slight discomfort with wrist flexion.  He is neurovascular intact distally.  Specialty Comments:  No specialty comments available.  Imaging: No new imaging   PMFS History: Patient Active Problem List   Diagnosis Date Noted  . Type 2 diabetes mellitus without complication, without long-term current use of insulin (HCC) 04/05/2019  . COVID-19 07/03/2018  . Rhinitis, allergic 05/22/2015   Past Medical History:  Diagnosis Date  . Diabetes mellitus without complication (HCC)   . No pertinent past medical history     Family History  Problem Relation Age of Onset  . Hypertension Neg Hx   . Heart disease Neg Hx   . Cancer Neg Hx   . AAA (abdominal aortic aneurysm) Neg Hx     Past Surgical History:  Procedure Laterality Date  . NO PAST SURGERIES     Social History   Occupational History  . Not on file  Tobacco Use  . Smoking status: Never Smoker  . Smokeless tobacco: Never Used  Vaping Use  . Vaping Use: Never used  Substance and Sexual Activity  . Alcohol use: Yes    Comment: socially  . Drug use: Never  . Sexual activity: Not on file

## 2020-08-02 ENCOUNTER — Telehealth: Payer: Self-pay

## 2020-08-02 ENCOUNTER — Other Ambulatory Visit: Payer: Self-pay

## 2020-08-02 ENCOUNTER — Ambulatory Visit: Payer: 59 | Attending: Physician Assistant | Admitting: Occupational Therapy

## 2020-08-02 DIAGNOSIS — M6281 Muscle weakness (generalized): Secondary | ICD-10-CM | POA: Diagnosis present

## 2020-08-02 DIAGNOSIS — M25532 Pain in left wrist: Secondary | ICD-10-CM

## 2020-08-02 DIAGNOSIS — M25632 Stiffness of left wrist, not elsewhere classified: Secondary | ICD-10-CM | POA: Diagnosis present
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
## 2020-08-02 NOTE — Telephone Encounter (Signed)
Yes - that would be fine.  Thank you.

## 2020-08-08 ENCOUNTER — Other Ambulatory Visit: Payer: Self-pay

## 2020-08-08 ENCOUNTER — Encounter: Payer: Self-pay | Admitting: Occupational Therapy

## 2020-08-08 ENCOUNTER — Ambulatory Visit: Payer: 59 | Admitting: Occupational Therapy

## 2020-08-08 DIAGNOSIS — M25532 Pain in left wrist: Secondary | ICD-10-CM | POA: Diagnosis not present

## 2020-08-08 DIAGNOSIS — M6281 Muscle weakness (generalized): Secondary | ICD-10-CM

## 2020-08-08 DIAGNOSIS — M25632 Stiffness of left wrist, not elsewhere classified: Secondary | ICD-10-CM
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
## 2020-08-10 ENCOUNTER — Ambulatory Visit: Payer: 59 | Attending: Physician Assistant | Admitting: Occupational Therapy

## 2020-08-10 ENCOUNTER — Other Ambulatory Visit: Payer: Self-pay

## 2020-08-10 ENCOUNTER — Encounter: Payer: Self-pay | Admitting: Occupational Therapy

## 2020-08-10 DIAGNOSIS — M6281 Muscle weakness (generalized): Secondary | ICD-10-CM | POA: Diagnosis present

## 2020-08-10 DIAGNOSIS — M25532 Pain in left wrist: Secondary | ICD-10-CM | POA: Diagnosis not present

## 2020-08-10 DIAGNOSIS — M25632 Stiffness of left wrist, not elsewhere classified: Secondary | ICD-10-CM | POA: Diagnosis present
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
## 2020-08-15 ENCOUNTER — Ambulatory Visit (INDEPENDENT_AMBULATORY_CARE_PROVIDER_SITE_OTHER): Payer: 59 | Admitting: Orthopaedic Surgery

## 2020-08-15 DIAGNOSIS — M25532 Pain in left wrist: Secondary | ICD-10-CM | POA: Diagnosis not present

## 2020-08-15 NOTE — Progress Notes (Signed)
   Office Visit Note   Patient: Javier [name]           Date of Birth: June 01, 1970           MRN: [id number] Visit Date: 08/15/2020              Requested by: Janeece Agee, NP [address] [place],  [place] [address] PCP: Janeece Agee, NP   Assessment & Plan: Visit Diagnoses:  1. 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  The patient is getting great relief with hand therapy and Voltaren gel.  He will continue with this method of treatment.  Follow-up with [place] as needed.  Follow-Up Instructions: Return if symptoms worsen or fail to improve.   Orders:  No orders of the defined types were placed in this encounter.  No orders of the defined types were placed in this encounter.     Procedures: No procedures performed   Clinical Data: No additional findings.   Subjective: Chief Complaint  Patient presents with  . Right Wrist - Follow-up    HPI patient is a pleasant 50 year old gentleman who comes in today for follow-up of his left wrist.  History of radiocarpal and radioulnar joint osteoarthritis.  He has been in hand therapy and using topical NSAIDs with great relief in symptoms.  He still notes some discomfort with praying but overall has improved quite a bit.     Objective: Vital Signs: There were no vitals taken for this visit.    Ortho Exam left wrist exam shows no swelling.  Mild tenderness to the radiocarpal joint.  Slight pain with the extremes of wrist extension.  He is neurovascular intact distally.  Specialty Comments:  No specialty comments available.  Imaging: No new imaging   PMFS History: Patient Active Problem List   Diagnosis Date Noted  . Type 2 diabetes mellitus without complication, without long-term current use of insulin (HCC) 04/05/2019  . COVID-19 07/03/2018  . Rhinitis, allergic 05/22/2015   Past Medical History:  Diagnosis Date  . Diabetes mellitus without  complication (HCC)   . No pertinent past medical history     Family History  Problem Relation Age of Onset  . Hypertension Neg Hx   . Heart disease Neg Hx   . Cancer Neg Hx   . AAA (abdominal aortic aneurysm) Neg Hx     Past Surgical History:  Procedure Laterality Date  . NO PAST SURGERIES     Social History   Occupational History  . Not on file  Tobacco Use  . Smoking status: Never Smoker  . Smokeless tobacco: Never Used  Vaping Use  . Vaping Use: Never used  Substance and Sexual Activity  . Alcohol use: Yes    Comment: socially  . Drug use: Never  . Sexual activity: Not on file

## 2020-08-16 ENCOUNTER — Ambulatory Visit: Payer: 59 | Admitting: Occupational Therapy

## 2020-08-16 ENCOUNTER — Other Ambulatory Visit: Payer: Self-pay

## 2020-08-16 DIAGNOSIS — M25632 Stiffness of left wrist, not elsewhere classified: Secondary | ICD-10-CM

## 2020-08-16 DIAGNOSIS — M25532 Pain in left wrist: Secondary | ICD-10-CM | POA: Diagnosis not present

## 2020-08-16 DIAGNOSIS — M6281 Muscle weakness (generalized): Secondary | ICD-10-CM
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
## 2020-08-23 ENCOUNTER — Other Ambulatory Visit: Payer: Self-pay

## 2020-08-23 ENCOUNTER — Encounter: Payer: Self-pay | Admitting: Occupational Therapy

## 2020-08-23 ENCOUNTER — Ambulatory Visit: Payer: 59 | Admitting: Occupational Therapy

## 2020-08-23 DIAGNOSIS — M25532 Pain in left wrist: Secondary | ICD-10-CM | POA: Diagnosis not present

## 2020-08-23 DIAGNOSIS — M25632 Stiffness of left wrist, not elsewhere classified: Secondary | ICD-10-CM

## 2020-08-23 DIAGNOSIS — M6281 Muscle weakness (generalized): Secondary | ICD-10-CM
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
## 2020-08-24 ENCOUNTER — Ambulatory Visit: Payer: 59 | Admitting: Occupational Therapy

## 2020-08-24 ENCOUNTER — Encounter: Payer: Self-pay | Admitting: Occupational Therapy

## 2020-08-24 DIAGNOSIS — M25632 Stiffness of left wrist, not elsewhere classified: Secondary | ICD-10-CM

## 2020-08-24 DIAGNOSIS — M25532 Pain in left wrist: Secondary | ICD-10-CM | POA: Diagnosis not present

## 2020-08-24 DIAGNOSIS — M6281 Muscle weakness (generalized): Secondary | ICD-10-CM
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
## 2020-08-29 ENCOUNTER — Ambulatory Visit: Payer: 59 | Admitting: Occupational Therapy

## 2020-08-31 ENCOUNTER — Ambulatory Visit: Payer: 59 | Admitting: Occupational Therapy

## 2020-08-31 ENCOUNTER — Other Ambulatory Visit: Payer: Self-pay

## 2020-08-31 DIAGNOSIS — M25532 Pain in left wrist: Secondary | ICD-10-CM | POA: Diagnosis not present

## 2020-08-31 DIAGNOSIS — M6281 Muscle weakness (generalized): Secondary | ICD-10-CM

## 2020-08-31 DIAGNOSIS — M25632 Stiffness of left wrist, not elsewhere classified: Secondary | ICD-10-CM

## 2020-08-31 NOTE — Therapy (Signed)
[facility] [address] [place], [place], [address] Phone: [phone]   Fax:  [phone]  Occupational Therapy Treatment  Patient Details  Name: Javier [name] MRN: [id number] Date of Birth: 01-30-1971 Referring Provider (OT): Dr. Gershon Mussel   Encounter Date: 08/31/2020   OT End of Session - 08/31/20 1613     Visit Number 7    Number of Visits 12    Date for OT Re-Evaluation 09/17/20    Authorization Type [employer]    OT Start Time 1536    OT Stop Time 1618    OT Time Calculation (min) 42 min    Activity Tolerance Patient tolerated treatment well    Behavior During Therapy [facility] for tasks assessed/performed             Past Medical History:  Diagnosis Date   Diabetes mellitus without complication (HCC)    No pertinent past medical history     Past Surgical History:  Procedure Laterality Date   NO PAST SURGERIES      There were no vitals filed for this visit.                 OT Treatments/Exercises (OP) - 08/31/20 0001       Ultrasound   Ultrasound Location wrist    Ultrasound Parameters , 20%, 0.8w/cm2 x5 min    Ultrasound Goals Edema      Iontophoresis   Type of Iontophoresis Dexamethasone    Location dorsal wrist    Dose 2.0    Time 25 min including set up      Manual Therapy   Manual therapy comments edema massage - thick small pocket dorsum of wrist radio-carpal junction                      OT Short Term Goals - 08/31/20 1616       OT SHORT TERM GOAL #1   Title Independent with HEP    Time 3    Period Weeks    Status Achieved      OT SHORT TERM GOAL #2   Title Pt to verbalize understanding with pain management strategies including: splinting, modalities, and task modifications    Time 3    Period Weeks    Status Achieved      OT SHORT TERM GOAL #3   Title Pt to verbalize understanding with any necessary A/E to reduce pain    Time 3     Period Weeks    Status Achieved               OT Long Term Goals - 08/31/20 1616       OT LONG TERM GOAL #1   Title Independent with updated HEP    Time 6    Period Weeks    Status On-going      OT LONG TERM GOAL #2   Title Improve Lt wrist flexion by 15 degrees and wrist extension by 8 degrees or more    Baseline Lt wrist flex = 35* (Rt = 82*), Lt wrist ext = 45* (Rt = 70*)    Time 6    Period Weeks    Status On-going      OT LONG TERM GOAL #3   Title Pt to improve Lt grip strength to 40 lbs or greater    Baseline 14.9 lbs (Rt = 107.3 lbs)    Time 6    Period Weeks  Status On-going      OT LONG TERM GOAL #4   Title Pt to improve 3 tip pinch Lt hand to 10 lbs or greater    Baseline 5 lbs (Rt = 28 lbs)    Time 6    Period Weeks    Status On-going                   Plan - 08/31/20 1615     Clinical Impression Statement Pt continues to demonstrate significant improvement with reduction in wrist pain and swelling.  Still tender at radial carpal juncture    OT Occupational Profile and History Problem Focused Assessment - Including review of records relating to presenting problem    Occupational performance deficits (Please refer to evaluation for details): IADL's;Work    Body Structure / Function / Physical Skills ADL;Strength;Pain;Edema;Body mechanics;UE functional use;IADL;ROM;Flexibility    Rehab Potential Good    Clinical Decision Making Several treatment options, min-mod task modification necessary    Comorbidities Affecting Occupational Performance: None    Modification or Assistance to Complete Evaluation  No modification of tasks or assist necessary to complete eval    OT Frequency 2x / week    OT Duration 6 weeks    OT Treatment/Interventions Self-care/ADL training;Moist Heat;Fluidtherapy;Splinting;DME and/or AE instruction;Therapeutic activities;Therapeutic exercise;Ultrasound;Iontophoresis;Cryotherapy;Passive range of motion;Manual  Therapy;Patient/family education;Paraffin    Plan isometrics or light strengthening with weight if pain is improved, Ultrasound prn    Consulted and Agree with Plan of Care Patient             Patient will benefit from skilled therapeutic intervention in order to improve the following deficits and impairments:   Body Structure / Function / Physical Skills: ADL, Strength, Pain, Edema, Body mechanics, UE functional use, IADL, ROM, Flexibility       Visit Diagnosis: Pain in left wrist  Stiffness of left wrist, not elsewhere classified  Muscle weakness (generalized)    Problem List Patient Active Problem List   Diagnosis Date Noted   Type 2 diabetes mellitus without complication, without long-term current use of insulin (HCC) 04/05/2019   COVID-19 07/03/2018   Rhinitis, allergic 05/22/2015    Collier Salina 08/31/2020, 4:17 PM  [facility] [address] [place], [place], [address] Phone: [phone]   Fax:  [phone]  Name: Javier [name] MRN: [id number] Date of Birth: [birth year]

## 2020-09-05 ENCOUNTER — Ambulatory Visit: Payer: 59 | Admitting: Occupational Therapy

## 2020-09-06 ENCOUNTER — Ambulatory Visit: Payer: 59 | Admitting: Occupational Therapy

## 2020-09-06 ENCOUNTER — Other Ambulatory Visit: Payer: Self-pay

## 2020-09-06 DIAGNOSIS — M6281 Muscle weakness (generalized): Secondary | ICD-10-CM

## 2020-09-06 DIAGNOSIS — M25632 Stiffness of left wrist, not elsewhere classified: Secondary | ICD-10-CM

## 2020-09-06 DIAGNOSIS — M25532 Pain in left wrist: Secondary | ICD-10-CM | POA: Diagnosis not present

## 2020-09-06 NOTE — Patient Instructions (Addendum)
Wrist Extension: Isometric    With left forearm resting palm down on thigh, resist upward movement of hand with other hand. Hold __10__ seconds. Relax. Repeat __5__ times per set.  Do __2-3__ sessions per day.  Wrist Radial Deviation: Isometric    With left forearm resting on thigh, thumb up, use other hand to resist upward movement of hand at wrist. Hold _10___ seconds. Relax. Repeat _5___ times per set. Do __2-3__ sessions per day.  Ulnar Deviation (Isometric)    With forearm held steady and thumb up, use other hand to resist downward movement at wrist. Hold _10___ seconds. Relax. Repeat __5__ times. Do __2-3__ sessions per day.  Flexion (Isometric)    With forearm held steady, palm up, use other hand to resist upward movement of hand at wrist. Hold __10__ seconds. Relax. Repeat _5___ times. Do _2-3___ sessions per day.  1. Grip Strengthening (Resistive Putty)   Squeeze putty using thumb and all fingers. Repeat _20___ times. Do __2__ sessions per day.   2. Roll putty into tube on table and pinch between first two fingers and thumb x 10 reps. Do 2 sessions per day.    Copyright  [employer]. All rights reserved.
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
## 2020-09-07 ENCOUNTER — Ambulatory Visit: Payer: 59 | Admitting: Occupational Therapy

## 2020-09-07 DIAGNOSIS — M25532 Pain in left wrist: Secondary | ICD-10-CM

## 2020-09-07 DIAGNOSIS — M25632 Stiffness of left wrist, not elsewhere classified: Secondary | ICD-10-CM

## 2020-09-07 DIAGNOSIS — M6281 Muscle weakness (generalized): Secondary | ICD-10-CM
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
## 2020-09-13 ENCOUNTER — Other Ambulatory Visit: Payer: Self-pay

## 2020-09-13 ENCOUNTER — Encounter: Payer: Self-pay | Admitting: Registered Nurse

## 2020-09-13 ENCOUNTER — Ambulatory Visit (INDEPENDENT_AMBULATORY_CARE_PROVIDER_SITE_OTHER): Payer: 59 | Admitting: Registered Nurse

## 2020-09-13 VITALS — BP 108/70 | HR 79 | Temp 98.3°F | Ht 65.0 in | Wt 162.8 lb

## 2020-09-13 DIAGNOSIS — G479 Sleep disorder, unspecified: Secondary | ICD-10-CM | POA: Diagnosis not present

## 2020-09-13 DIAGNOSIS — Z8639 Personal history of other endocrine, nutritional and metabolic disease: Secondary | ICD-10-CM

## 2020-09-13 DIAGNOSIS — F411 Generalized anxiety disorder: Secondary | ICD-10-CM | POA: Diagnosis not present

## 2020-09-13 DIAGNOSIS — R202 Paresthesia of skin: Secondary | ICD-10-CM

## 2020-09-13 LAB — COMPREHENSIVE METABOLIC PANEL
ALT: 13 U/L (ref 0–53)
AST: 15 U/L (ref 0–37)
Albumin: 4.5 g/dL (ref 3.5–5.2)
Alkaline Phosphatase: 28 U/L — ABNORMAL LOW (ref 39–117)
BUN: 12 mg/dL (ref 6–23)
CO2: 26 mEq/L (ref 19–32)
Calcium: 9.4 mg/dL (ref 8.4–10.5)
Chloride: 105 mEq/L (ref 96–112)
Creatinine, Ser: 0.93 mg/dL (ref 0.40–1.50)
GFR: 95.07 mL/min (ref >60.00)
Glucose, Bld: 99 mg/dL (ref 70–99)
Potassium: 4.3 mEq/L (ref 3.5–5.1)
Sodium: 138 mEq/L (ref 135–145)
Total Bilirubin: 0.9 mg/dL (ref 0.2–1.2)
Total Protein: 7.4 g/dL (ref 6.0–8.3)

## 2020-09-13 LAB — VITAMIN D 25 HYDROXY (VIT D DEFICIENCY, FRACTURES): VITD: 18.28 ng/mL — ABNORMAL LOW (ref 30.00–100.00)

## 2020-09-13 LAB — LIPID PANEL
Cholesterol: 173 mg/dL (ref 0–200)
HDL: 53.2 mg/dL (ref >39.00)
LDL Cholesterol: 110 mg/dL — ABNORMAL HIGH (ref 0–99)
NonHDL: 119.79
Total CHOL/HDL Ratio: 3
Triglycerides: 49 mg/dL (ref 0.0–149.0)
VLDL: 9.8 mg/dL (ref 0.0–40.0)

## 2020-09-13 LAB — TSH: TSH: 1.54 u[IU]/mL (ref 0.35–5.50)

## 2020-09-13 LAB — VITAMIN B12: Vitamin B-12: 665 pg/mL (ref 211–911)

## 2020-09-13 MED ORDER — MIRTAZAPINE 15 MG PO TABS
15.0000 mg | ORAL_TABLET | Freq: Every day | ORAL | 1 refills | Status: DC
Start: 2020-09-13 — End: 2021-09-25
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
## 2020-09-14 ENCOUNTER — Telehealth: Payer: Self-pay

## 2020-09-14 NOTE — Telephone Encounter (Signed)
Pt is calling he got all his RX's except one and needs it sent to the pharmacy. Vitamin D 50,000 from 07/06 visit and pharmacy has not received this yet? Pt uses [facility]   Pt call back [phone] or [phone]

## 2020-09-14 NOTE — Telephone Encounter (Signed)
Pt is leaving tomorrow night to go on a trip for 10 days and wants to receive meds before he leaves

## 2020-09-15 ENCOUNTER — Ambulatory Visit: Payer: Self-pay | Admitting: Registered Nurse

## 2020-09-15 ENCOUNTER — Other Ambulatory Visit: Payer: Self-pay

## 2020-09-15 MED ORDER — VITAMIN D (ERGOCALCIFEROL) 1.25 MG (50000 UNIT) PO CAPS: 50000.0000 [IU] | ORAL_CAPSULE | ORAL | 0 refills | Status: AC

## 2020-09-15 NOTE — Telephone Encounter (Signed)
Medication has been sent to patients pharmacy.

## 2020-09-19 ENCOUNTER — Ambulatory Visit: Payer: 59 | Admitting: Occupational Therapy

## 2020-09-20 ENCOUNTER — Ambulatory Visit: Payer: 59 | Admitting: Occupational Therapy

## 2020-09-26 ENCOUNTER — Ambulatory Visit: Payer: 59 | Attending: Physician Assistant | Admitting: Occupational Therapy

## 2020-09-26 ENCOUNTER — Encounter: Payer: Self-pay | Admitting: Occupational Therapy

## 2020-09-26 ENCOUNTER — Other Ambulatory Visit: Payer: Self-pay

## 2020-09-26 DIAGNOSIS — M25532 Pain in left wrist: Secondary | ICD-10-CM | POA: Diagnosis present

## 2020-09-26 DIAGNOSIS — M25632 Stiffness of left wrist, not elsewhere classified: Secondary | ICD-10-CM | POA: Diagnosis present

## 2020-09-26 DIAGNOSIS — M6281 Muscle weakness (generalized): Secondary | ICD-10-CM | POA: Insufficient documentation
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
## 2020-11-21 ENCOUNTER — Other Ambulatory Visit: Payer: Self-pay | Admitting: Registered Nurse

## 2020-11-21 ENCOUNTER — Other Ambulatory Visit: Payer: Self-pay

## 2020-11-21 ENCOUNTER — Other Ambulatory Visit (INDEPENDENT_AMBULATORY_CARE_PROVIDER_SITE_OTHER): Payer: 59

## 2020-11-21 DIAGNOSIS — Z125 Encounter for screening for malignant neoplasm of prostate: Secondary | ICD-10-CM | POA: Diagnosis not present

## 2020-11-21 DIAGNOSIS — Z1211 Encounter for screening for malignant neoplasm of colon: Secondary | ICD-10-CM

## 2020-11-22 ENCOUNTER — Encounter: Payer: Self-pay | Admitting: Registered Nurse

## 2020-11-22 LAB — PSA: PSA: 0.59 ng/mL (ref 0.10–4.00)

## 2020-11-23 NOTE — Telephone Encounter (Signed)
Attempt to call patient to let him know that his lab results were great. Patient mailbox is currently full.

## 2020-12-05 ENCOUNTER — Encounter: Payer: Self-pay | Admitting: Registered Nurse

## 2020-12-05 LAB — COLOGUARD: Cologuard: NEGATIVE

## 2021-03-14 ENCOUNTER — Ambulatory Visit: Payer: 59 | Admitting: Registered Nurse

## 2021-03-21 ENCOUNTER — Other Ambulatory Visit: Payer: Self-pay

## 2021-03-21 ENCOUNTER — Encounter: Payer: Self-pay | Admitting: Registered Nurse

## 2021-03-21 ENCOUNTER — Ambulatory Visit (INDEPENDENT_AMBULATORY_CARE_PROVIDER_SITE_OTHER): Payer: 59 | Admitting: Registered Nurse

## 2021-03-21 VITALS — BP 130/86 | HR 72 | Temp 98.2°F | Resp 17 | Ht 65.0 in | Wt 158.0 lb

## 2021-03-21 DIAGNOSIS — E559 Vitamin D deficiency, unspecified: Secondary | ICD-10-CM | POA: Diagnosis not present

## 2021-03-21 DIAGNOSIS — E119 Type 2 diabetes mellitus without complications: Secondary | ICD-10-CM

## 2021-03-21 DIAGNOSIS — Z1159 Encounter for screening for other viral diseases: Secondary | ICD-10-CM | POA: Diagnosis not present

## 2021-03-21 LAB — POCT GLYCOSYLATED HEMOGLOBIN (HGB A1C): Hemoglobin A1C: 5.3 % (ref 4.0–5.6)

## 2021-03-21 NOTE — Progress Notes (Signed)
Established Patient Office Visit  Subjective:  Patient ID: Javier [name], male    DOB: 04/03/1970  Age: 51 y.o. MRN: [id number]  CC:  Chief Complaint  Patient presents with   Follow-up    Patient states he is here for 6 month follow up.    HPI Javier [name] presents for 6 mo follow up t2dm  Last A1c:  Lab Results  Component Value Date   HGBA1C 5.3 03/21/2021    Currently taking: lifestyle management No new complications Reports good compliance with medications Diet has been steady, healthy Exercise habits have been steady, healthy   Past Medical History:  Diagnosis Date   Diabetes mellitus without complication (HCC)    No pertinent past medical history     Past Surgical History:  Procedure Laterality Date   NO PAST SURGERIES      Family History  Problem Relation Age of Onset   Hypertension Neg Hx    Heart disease Neg Hx    Cancer Neg Hx    AAA (abdominal aortic aneurysm) Neg Hx     Social History   Socioeconomic History   Marital status: Married    Spouse name: Not on file   Number of children: 3   Years of education: Not on file   Highest education level: Not on file  Occupational History   Not on file  Tobacco Use   Smoking status: Never   Smokeless tobacco: Never  Vaping Use   Vaping Use: Never used  Substance and Sexual Activity   Alcohol use: Yes    Comment: socially   Drug use: Never   Sexual activity: Not on file  Other Topics Concern   Not on file  Social History Narrative   Married with 3 children   Works Financial trader   Social Determinants of Health   Financial Resource Strain: Not on file  Food Insecurity: Not on file  Transportation Needs: Not on file  Physical Activity: Not on file  Stress: Not on file  Social Connections: Not on file  Intimate Partner Violence: Not on file    Outpatient Medications Prior to Visit  Medication Sig Dispense Refill   meloxicam (MOBIC) 7.5 MG tablet Take 1 tablet (7.5 mg  total) by mouth 2 (two) times daily as needed for pain. 30 tablet 2   mirtazapine (REMERON) 15 MG tablet Take 1 tablet (15 mg total) by mouth at bedtime. 90 tablet 1   Multiple Vitamin (MULTIVITAMIN) tablet Take 1 tablet by mouth daily.     cyclobenzaprine (FLEXERIL) 5 MG tablet Take 1 tablet (5 mg total) by mouth 3 (three) times daily as needed for muscle spasms. (Patient not taking: Reported on 09/13/2020) 30 tablet 1   diclofenac (VOLTAREN) 75 MG EC tablet Take 1 tablet (75 mg total) by mouth 2 (two) times daily. (Patient not taking: Reported on 09/13/2020) 30 tablet 0   No facility-administered medications prior to visit.    No Known Allergies  ROS Review of Systems  Constitutional: Negative.   HENT: Negative.    Eyes: Negative.   Respiratory: Negative.    Cardiovascular: Negative.   Gastrointestinal: Negative.   Genitourinary: Negative.   Musculoskeletal: Negative.   Skin: Negative.   Neurological: Negative.   Psychiatric/Behavioral: Negative.    All other systems reviewed and are negative.    Objective:    Physical Exam Constitutional:      General: He is not in acute distress.    Appearance: Normal appearance. He is normal  weight. He is not ill-appearing, toxic-appearing or diaphoretic.  Cardiovascular:     Rate and Rhythm: Normal rate and regular rhythm.     Heart sounds: Normal heart sounds. No murmur heard.   No friction rub. No gallop.  Pulmonary:     Effort: Pulmonary effort is normal. No respiratory distress.     Breath sounds: Normal breath sounds. No stridor. No wheezing, rhonchi or rales.  Chest:     Chest wall: No tenderness.  Neurological:     General: No focal deficit present.     Mental Status: He is alert and oriented to person, place, and time. Mental status is at baseline.  Psychiatric:        Mood and Affect: Mood normal.        Behavior: Behavior normal.        Thought Content: Thought content normal.        Judgment: Judgment normal.    BP  130/86    Pulse 72    Temp 98.2 F (36.8 C) (Temporal)    Resp 17    Ht 5\' 5"  (1.651 m)    Wt 158 lb (71.7 kg)    BMI 26.29 kg/m  Wt Readings from Last 3 Encounters:  03/21/21 158 lb (71.7 kg)  09/13/20 162 lb 12.8 oz (73.8 kg)  05/10/20 161 lb 9.6 oz (73.3 kg)     Health Maintenance Due  Topic Date Due   Hepatitis C Screening  Never done   COLONOSCOPY (Pts 45-79yrs Insurance coverage will need to be confirmed)  Never done   INFLUENZA VACCINE  10/09/2020   URINE MICROALBUMIN  03/17/2021    There are no preventive care reminders to display for this patient.  Lab Results  Component Value Date   TSH 1.54 09/13/2020   Lab Results  Component Value Date   WBC 4.6 03/17/2020   HGB 17.0 03/17/2020   HCT 49.5 03/17/2020   MCV 94 03/17/2020   PLT 220 04/05/2019   Lab Results  Component Value Date   NA 138 09/13/2020   K 4.3 09/13/2020   CO2 26 09/13/2020   GLUCOSE 99 09/13/2020   BUN 12 09/13/2020   CREATININE 0.93 09/13/2020   BILITOT 0.9 09/13/2020   ALKPHOS 28 (L) 09/13/2020   AST 15 09/13/2020   ALT 13 09/13/2020   PROT 7.4 09/13/2020   ALBUMIN 4.5 09/13/2020   CALCIUM 9.4 09/13/2020   ANIONGAP 10 07/07/2018   GFR 95.07 09/13/2020   Lab Results  Component Value Date   CHOL 173 09/13/2020   Lab Results  Component Value Date   HDL 53.20 09/13/2020   Lab Results  Component Value Date   LDLCALC 110 (H) 09/13/2020   Lab Results  Component Value Date   TRIG 49.0 09/13/2020   Lab Results  Component Value Date   CHOLHDL 3 09/13/2020   Lab Results  Component Value Date   HGBA1C 5.3 03/21/2021      Assessment & Plan:   Problem List Items Addressed This Visit       Endocrine   Type 2 diabetes mellitus without complication, without long-term current use of insulin (HCC) - Primary   Relevant Orders   POCT glycosylated hemoglobin (Hb A1C) (Completed)   Comprehensive metabolic panel   Lipid panel   Other Visit Diagnoses     Vitamin D deficiency        Relevant Orders   Vitamin D (25 hydroxy)   Encounter for screening for other viral diseases  Relevant Orders   Hepatitis C antibody       No orders of the defined types were placed in this encounter.   Follow-up: Return in about 6 months (around 09/18/2021) for CPE and labs.   PLAN A1c today at 5.3.  Continue healthy lifestyle. Return in 1 year for CPE and labs. Patient encouraged to call clinic with any questions, comments, or concerns.  Janeece Ageeichard AmeLie Hollars, NP

## 2021-03-21 NOTE — Patient Instructions (Signed)
° ° ° °  If you have lab work done today you will be contacted with your lab results within the next 2 weeks.  If you have not heard from us then please contact us. The fastest way to get your results is to register for My Chart. ° ° °IF you received an x-ray today, you will receive an invoice from [facility]. Please contact  Radiology at [phone] with questions or concerns regarding your invoice.  ° °IF you received labwork today, you will receive an invoice from [facility]. Please contact [facility] at [phone] with questions or concerns regarding your invoice.  ° °Our billing staff will not be able to assist you with questions regarding bills from these companies. ° °You will be contacted with the lab results as soon as they are available. The fastest way to get your results is to activate your My Chart account. Instructions are located on the last page of this paperwork. If you have not heard from us regarding the results in 2 weeks, please contact this office. °  ° ° ° °

## 2021-03-22 ENCOUNTER — Encounter: Payer: Self-pay | Admitting: Registered Nurse

## 2021-03-22 LAB — LIPID PANEL
Cholesterol: 166 mg/dL (ref 0–200)
HDL: 59.6 mg/dL (ref >39.00)
LDL Cholesterol: 97 mg/dL (ref 0–99)
NonHDL: 105.97
Total CHOL/HDL Ratio: 3
Triglycerides: 43 mg/dL (ref 0.0–149.0)
VLDL: 8.6 mg/dL (ref 0.0–40.0)

## 2021-03-22 LAB — COMPREHENSIVE METABOLIC PANEL
ALT: 17 U/L (ref 0–53)
AST: 21 U/L (ref 0–37)
Albumin: 4.6 g/dL (ref 3.5–5.2)
Alkaline Phosphatase: 30 U/L — ABNORMAL LOW (ref 39–117)
BUN: 15 mg/dL (ref 6–23)
CO2: 25 mEq/L (ref 19–32)
Calcium: 9.5 mg/dL (ref 8.4–10.5)
Chloride: 104 mEq/L (ref 96–112)
Creatinine, Ser: 0.93 mg/dL (ref 0.40–1.50)
GFR: 94.73 mL/min (ref >60.00)
Glucose, Bld: 81 mg/dL (ref 70–99)
Potassium: 4 mEq/L (ref 3.5–5.1)
Sodium: 138 mEq/L (ref 135–145)
Total Bilirubin: 0.8 mg/dL (ref 0.2–1.2)
Total Protein: 7.9 g/dL (ref 6.0–8.3)

## 2021-03-22 LAB — HEPATITIS C ANTIBODY
Hepatitis C Ab: NONREACTIVE
SIGNAL TO CUT-OFF: 0.08 (ref <1.00)

## 2021-03-22 LAB — VITAMIN D 25 HYDROXY (VIT D DEFICIENCY, FRACTURES): VITD: 29.48 ng/mL — ABNORMAL LOW (ref 30.00–100.00)

## 2021-03-22 NOTE — Telephone Encounter (Signed)
Pt asking if he should continue Rx Vit D can you please advise with recent lab work stipp being slightly low

## 2021-04-28 NOTE — Progress Notes (Signed)
Established Patient Office Visit  Subjective:  Patient ID: Javier [name], male    DOB: 02/24/1971  Age: 51 y.o. MRN: [id number]  CC:  Chief Complaint  Patient presents with   Medical Management of Chronic Issues    F/u      HPI Javier [name] presents for t2dm  Last A1c:  Lab Results  Component Value Date   HGBA1C 5.3 03/21/2021    Currently taking: lifestyle management No new complications Reports good compliance with medications Diet has been healthy Exercise habits have been steady, walking   Past Medical History:  Diagnosis Date   Diabetes mellitus without complication ([place])    No pertinent past medical history     Past Surgical History:  Procedure Laterality Date   NO PAST SURGERIES      Family History  Problem Relation Age of Onset   Hypertension Neg Hx    Heart disease Neg Hx    Cancer Neg Hx    AAA (abdominal aortic aneurysm) Neg Hx     Social History   Socioeconomic History   Marital status: Married    Spouse name: Not on file   Number of children: 3   Years of education: Not on file   Highest education level: Not on file  Occupational History   Not on file  Tobacco Use   Smoking status: Never   Smokeless tobacco: Never  Vaping Use   Vaping Use: Never used  Substance and Sexual Activity   Alcohol use: Yes    Comment: socially   Drug use: Never   Sexual activity: Not on file  Other Topics Concern   Not on file  Social History Narrative   Married with 3 children   Works Scientist, research (medical)   Social Determinants of Health   Financial Resource Strain: Not on file  Food Insecurity: Not on file  Transportation Needs: Not on file  Physical Activity: Not on file  Stress: Not on file  Social Connections: Not on file  Intimate Partner Violence: Not on file    No outpatient medications prior to visit.   No facility-administered medications prior to visit.    No Known Allergies  ROS Review of Systems  Constitutional:  Negative.   HENT: Negative.    Eyes: Negative.   Respiratory: Negative.    Cardiovascular: Negative.   Gastrointestinal: Negative.   Genitourinary: Negative.   Musculoskeletal: Negative.   Skin: Negative.   Neurological: Negative.   Psychiatric/Behavioral: Negative.    All other systems reviewed and are negative.    Objective:    Physical Exam Constitutional:      General: He is not in acute distress.    Appearance: Normal appearance. He is normal weight. He is not ill-appearing, toxic-appearing or diaphoretic.  Cardiovascular:     Rate and Rhythm: Normal rate and regular rhythm.     Heart sounds: Normal heart sounds. No murmur heard.   No friction rub. No gallop.  Pulmonary:     Effort: Pulmonary effort is normal. No respiratory distress.     Breath sounds: Normal breath sounds. No stridor. No wheezing, rhonchi or rales.  Chest:     Chest wall: No tenderness.  Neurological:     General: No focal deficit present.     Mental Status: He is alert and oriented to person, place, and time. Mental status is at baseline.  Psychiatric:        Mood and Affect: Mood normal.  Behavior: Behavior normal.        Thought Content: Thought content normal.        Judgment: Judgment normal.    BP 117/78    Pulse 71    Temp 98.1 F (36.7 C) (Temporal)    Ht 5\' 5"  (1.651 m)    Wt 159 lb (72.1 kg)    SpO2 95%    BMI 26.46 kg/m  Wt Readings from Last 3 Encounters:  03/21/21 158 lb (71.7 kg)  09/13/20 162 lb 12.8 oz (73.8 kg)  05/10/20 161 lb 9.6 oz (73.3 kg)     Health Maintenance Due  Topic Date Due   OPHTHALMOLOGY EXAM  Never done   COLONOSCOPY (Pts 45-44yrs Insurance coverage will need to be confirmed)  Never done   COVID-19 Vaccine (3 - Booster for [place]coll series) 12/20/2019   INFLUENZA VACCINE  10/09/2020   URINE MICROALBUMIN  03/17/2021    There are no preventive care reminders to display for this patient.  Lab Results  Component Value Date   TSH 1.54 09/13/2020    Lab Results  Component Value Date   WBC 4.6 03/17/2020   HGB 17.0 03/17/2020   HCT 49.5 03/17/2020   MCV 94 03/17/2020   PLT 220 04/05/2019   Lab Results  Component Value Date   NA 138 03/21/2021   K 4.0 03/21/2021   CO2 25 03/21/2021   GLUCOSE 81 03/21/2021   BUN 15 03/21/2021   CREATININE 0.93 03/21/2021   BILITOT 0.8 03/21/2021   ALKPHOS 30 (L) 03/21/2021   AST 21 03/21/2021   ALT 17 03/21/2021   PROT 7.9 03/21/2021   ALBUMIN 4.6 03/21/2021   CALCIUM 9.5 03/21/2021   ANIONGAP 10 07/07/2018   GFR 94.73 03/21/2021   Lab Results  Component Value Date   CHOL 166 03/21/2021   Lab Results  Component Value Date   HDL 59.60 03/21/2021   Lab Results  Component Value Date   LDLCALC 97 03/21/2021   Lab Results  Component Value Date   TRIG 43.0 03/21/2021   Lab Results  Component Value Date   CHOLHDL 3 03/21/2021   Lab Results  Component Value Date   HGBA1C 5.3 03/21/2021      Assessment & Plan:   Problem List Items Addressed This Visit   None Visit Diagnoses     History of hematuria    -  Primary   Relevant Orders   CBC With Differential (Completed)   Comprehensive metabolic panel (Completed)   Hemoglobin A1c (Completed)   Microalbumin / creatinine urine ratio (Completed)   POCT URINALYSIS DIP (CLINITEK) (Completed)   History of diabetes mellitus, type II       Relevant Orders   CBC With Differential (Completed)   Comprehensive metabolic panel (Completed)   Hemoglobin A1c (Completed)   Microalbumin / creatinine urine ratio (Completed)   POCT URINALYSIS DIP (CLINITEK) (Completed)       No orders of the defined types were placed in this encounter.   Follow-up: No follow-ups on file.   PLAN Labs collected. Will follow up with the patient as warranted. Continues to have excellent control Follow up in 6 mo Patient encouraged to call clinic with any questions, comments, or concerns.  Maximiano Coss, NP

## 2021-07-03 ENCOUNTER — Encounter (HOSPITAL_COMMUNITY): Payer: Self-pay

## 2021-07-03 ENCOUNTER — Emergency Department (HOSPITAL_COMMUNITY)
Admission: EM | Admit: 2021-07-03 | Discharge: 2021-07-03 | Disposition: A | Discharge status: Home / Self Care | Payer: 59 | Attending: Emergency Medicine | Admitting: Emergency Medicine

## 2021-07-03 ENCOUNTER — Other Ambulatory Visit: Payer: Self-pay

## 2021-07-03 ENCOUNTER — Emergency Department (HOSPITAL_COMMUNITY): Payer: 59

## 2021-07-03 DIAGNOSIS — S3992XA Unspecified injury of lower back, initial encounter: Secondary | ICD-10-CM | POA: Insufficient documentation

## 2021-07-03 DIAGNOSIS — Y92410 Unspecified street and highway as the place of occurrence of the external cause: Secondary | ICD-10-CM | POA: Diagnosis not present

## 2021-07-03 DIAGNOSIS — S8992XA Unspecified injury of left lower leg, initial encounter: Secondary | ICD-10-CM | POA: Diagnosis not present

## 2021-07-03 DIAGNOSIS — S8991XA Unspecified injury of right lower leg, initial encounter: Secondary | ICD-10-CM | POA: Insufficient documentation

## 2021-07-03 MED ORDER — NAPROXEN 500 MG PO TABS
500.0000 mg | ORAL_TABLET | Freq: Once | ORAL | Status: AC
  Administered 2021-07-03: 500 mg via ORAL
  Filled 2021-07-03: qty 1

## 2021-07-03 MED ORDER — LIDOCAINE 5 % EX PTCH: 1.0000 | MEDICATED_PATCH | CUTANEOUS | 0 refills | Status: DC

## 2021-07-03 MED ORDER — NAPROXEN 500 MG PO TABS
500.0000 mg | ORAL_TABLET | Freq: Two times a day (BID) | ORAL | 0 refills | Status: DC
Start: 2021-07-03 — End: 2021-10-16
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
## 2021-07-03 NOTE — ED Notes (Signed)
Patient transported to X-ray 

## 2021-07-03 NOTE — ED Notes (Signed)
I provided reinforced discharge education based off of discharge instructions. Pt acknowledged and understood my education. Pt had no further questions/concerns for provider/myself.  °
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
## 2021-07-03 NOTE — Discharge Instructions (Addendum)
Remember you cannot wear the lidocaine patches for more than 12 hours in a row.  You may use up to 3 at the same time. ? ?I have also sent prescription strength naproxen.  You may take Tylenol with this however do not take ibuprofen with it.  Your work note is attached ?

## 2021-07-05 ENCOUNTER — Encounter: Payer: Self-pay | Admitting: Registered Nurse

## 2021-07-05 ENCOUNTER — Ambulatory Visit (INDEPENDENT_AMBULATORY_CARE_PROVIDER_SITE_OTHER): Payer: 59 | Admitting: Registered Nurse

## 2021-07-05 DIAGNOSIS — M791 Myalgia, unspecified site: Secondary | ICD-10-CM | POA: Diagnosis not present

## 2021-07-05 MED ORDER — CYCLOBENZAPRINE HCL 10 MG PO TABS: 10.0000 mg | ORAL_TABLET | Freq: Three times a day (TID) | ORAL | 0 refills | Status: DC | PRN

## 2021-07-05 MED ORDER — METHYLPREDNISOLONE ACETATE 80 MG/ML IJ SUSP
80.0000 mg | Freq: Once | INTRAMUSCULAR | Status: AC
  Administered 2021-07-05: 80 mg via INTRAMUSCULAR

## 2021-07-05 MED ORDER — DICLOFENAC SODIUM 75 MG PO TBEC: 75.0000 mg | DELAYED_RELEASE_TABLET | Freq: Two times a day (BID) | ORAL | 0 refills | Status: DC

## 2021-07-05 MED ORDER — METHYLPREDNISOLONE ACETATE 80 MG/ML IJ SUSP: 80.0000 mg | Freq: Once | INTRAMUSCULAR | 0 refills | Status: DC
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
## 2021-07-05 NOTE — Patient Instructions (Signed)
Mr. Whitefield - ? ?Great to see you! Sorry that you've had such bad luck. ? ?Depo medrol should start helping today. ? ?Stop naproxen, start diclofenac and flexeril ? ?If you are feeling any worse within 24 hours, let me know ? ?Thank you ? ?Rich  ?

## 2021-07-12 ENCOUNTER — Encounter: Payer: Self-pay | Admitting: Registered Nurse

## 2021-07-12 ENCOUNTER — Other Ambulatory Visit: Payer: Self-pay

## 2021-07-12 ENCOUNTER — Ambulatory Visit (INDEPENDENT_AMBULATORY_CARE_PROVIDER_SITE_OTHER): Payer: 59 | Admitting: Registered Nurse

## 2021-07-12 VITALS — BP 103/79 | HR 71 | Temp 98.0°F | Resp 18 | Ht 67.0 in | Wt 164.0 lb

## 2021-07-12 DIAGNOSIS — J9601 Acute respiratory failure with hypoxia: Secondary | ICD-10-CM | POA: Diagnosis not present

## 2021-07-12 DIAGNOSIS — F321 Major depressive disorder, single episode, moderate: Secondary | ICD-10-CM

## 2021-07-12 MED ORDER — DIAZEPAM 2 MG PO TABS: 2.0000 mg | ORAL_TABLET | Freq: Every evening | ORAL | 0 refills | Status: DC | PRN

## 2021-07-12 MED ORDER — FLUOXETINE HCL 20 MG PO CAPS: 20.0000 mg | ORAL_CAPSULE | Freq: Every day | ORAL | 3 refills | Status: DC

## 2021-07-12 NOTE — Patient Instructions (Addendum)
Mr. Debow -  ? ?Great to see you ? ?Just as the body takes time to heal, so does the mind.  ? ?I want you to start taking fluoxetine 20mg  every morning.  ? ?Take diazepam 2mg  every evening before bed. This will make you sleepy and help with pain. ? ?Call me if things get worse ? ?Come see me in 3-4 weeks. ? ?I will have a counselor call you to provide support ? ?Thank you ? ?Rich  ? ? ?If you have lab work done today you will be contacted with your lab results within the next 2 weeks.  If you have not heard from then please contact . The fastest way to get your results is to register for My Chart. ? ? ?IF you received an x-ray today, you will receive an invoice from [facility]. Please contact [facility] at [phone] with questions or concerns regarding your invoice.  ? ?IF you received labwork today, you will receive an invoice from [facility]. Please contact [facility] at [phone] with questions or concerns regarding your invoice.  ? ?Our billing staff will not be able to assist you with questions regarding bills from these companies. ? ?You will be contacted with the lab results as soon as they are available. The fastest way to get your results is to activate your My Chart account. Instructions are located on the last page of this paperwork. If you have not heard from [facility] regarding the results in 2 weeks, please contact this office. ?  ? ? ?
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
## 2021-07-25 ENCOUNTER — Ambulatory Visit (INDEPENDENT_AMBULATORY_CARE_PROVIDER_SITE_OTHER): Payer: 59 | Admitting: Psychology

## 2021-07-25 DIAGNOSIS — F4321 Adjustment disorder with depressed mood: Secondary | ICD-10-CM
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
## 2021-08-03 ENCOUNTER — Ambulatory Visit (INDEPENDENT_AMBULATORY_CARE_PROVIDER_SITE_OTHER): Payer: 59 | Admitting: Psychology

## 2021-08-03 DIAGNOSIS — F4321 Adjustment disorder with depressed mood: Secondary | ICD-10-CM | POA: Diagnosis not present
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
## 2021-08-07 ENCOUNTER — Ambulatory Visit (INDEPENDENT_AMBULATORY_CARE_PROVIDER_SITE_OTHER): Payer: 59 | Admitting: Psychology

## 2021-08-07 DIAGNOSIS — F4321 Adjustment disorder with depressed mood: Secondary | ICD-10-CM | POA: Diagnosis not present
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
## 2021-08-15 ENCOUNTER — Ambulatory Visit (INDEPENDENT_AMBULATORY_CARE_PROVIDER_SITE_OTHER): Payer: 59 | Admitting: Psychology

## 2021-08-15 DIAGNOSIS — F4321 Adjustment disorder with depressed mood: Secondary | ICD-10-CM | POA: Diagnosis not present
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
## 2021-08-23 ENCOUNTER — Ambulatory Visit: Payer: 59 | Admitting: Psychology

## 2021-09-20 ENCOUNTER — Ambulatory Visit: Payer: 59 | Admitting: Registered Nurse

## 2021-09-21 ENCOUNTER — Ambulatory Visit (INDEPENDENT_AMBULATORY_CARE_PROVIDER_SITE_OTHER): Payer: 59 | Admitting: Psychology

## 2021-09-21 ENCOUNTER — Ambulatory Visit: Payer: 59 | Admitting: Registered Nurse

## 2021-09-21 DIAGNOSIS — F4321 Adjustment disorder with depressed mood: Secondary | ICD-10-CM
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
## 2021-09-25 ENCOUNTER — Other Ambulatory Visit: Payer: Self-pay

## 2021-09-25 ENCOUNTER — Encounter: Payer: Self-pay | Admitting: Registered Nurse

## 2021-09-25 ENCOUNTER — Ambulatory Visit (INDEPENDENT_AMBULATORY_CARE_PROVIDER_SITE_OTHER): Payer: 59 | Admitting: Registered Nurse

## 2021-09-25 VITALS — BP 110/78 | HR 77 | Temp 98.0°F | Resp 18 | Ht 67.0 in | Wt 162.2 lb

## 2021-09-25 DIAGNOSIS — Z125 Encounter for screening for malignant neoplasm of prostate: Secondary | ICD-10-CM

## 2021-09-25 DIAGNOSIS — Z0000 Encounter for general adult medical examination without abnormal findings: Secondary | ICD-10-CM | POA: Diagnosis not present

## 2021-09-25 DIAGNOSIS — N529 Male erectile dysfunction, unspecified: Secondary | ICD-10-CM | POA: Diagnosis not present

## 2021-09-25 DIAGNOSIS — F321 Major depressive disorder, single episode, moderate: Secondary | ICD-10-CM

## 2021-09-25 DIAGNOSIS — E119 Type 2 diabetes mellitus without complications: Secondary | ICD-10-CM | POA: Diagnosis not present

## 2021-09-25 DIAGNOSIS — F411 Generalized anxiety disorder: Secondary | ICD-10-CM

## 2021-09-25 DIAGNOSIS — G479 Sleep disorder, unspecified: Secondary | ICD-10-CM

## 2021-09-25 MED ORDER — CYCLOBENZAPRINE HCL 10 MG PO TABS: 10.0000 mg | ORAL_TABLET | Freq: Three times a day (TID) | ORAL | 0 refills | Status: DC | PRN

## 2021-09-25 MED ORDER — MIRTAZAPINE 15 MG PO TABS: 15.0000 mg | ORAL_TABLET | Freq: Every day | ORAL | 1 refills | Status: DC

## 2021-09-25 MED ORDER — MELOXICAM 7.5 MG PO TABS: 7.5000 mg | ORAL_TABLET | Freq: Two times a day (BID) | ORAL | 2 refills | Status: DC | PRN

## 2021-09-25 MED ORDER — SILDENAFIL CITRATE 100 MG PO TABS: 50.0000 mg | ORAL_TABLET | Freq: Every day | ORAL | 11 refills | Status: DC | PRN

## 2021-09-25 MED ORDER — FLUOXETINE HCL 20 MG PO CAPS: 20.0000 mg | ORAL_CAPSULE | Freq: Every day | ORAL | 3 refills | Status: DC

## 2021-09-25 NOTE — Progress Notes (Signed)
Complete physical exam  Patient: Javier [name]   DOB: 09/01/1970   51 y.o. Male  MRN: [id number] Visit Date: 09/25/2021  Subjective:    Chief Complaint  Patient presents with   Annual Exam    Patient states he is here for CPE.    Javier [name] is a 51 y.o. male who presents today for a complete physical exam. He reports consuming a general diet. Home exercise routine includes walking 15 hrs per week. He generally feels well. He reports sleeping well. He does not have additional problems to discuss today.   Vision:Within the last year Dental:Within Last 6 months STD Screen:No PSA: Yes  Most recent fall risk assessment:    09/25/2021    2:31 PM  Fall Risk   Falls in the past year? 0  Number falls in past yr: 0  Injury with Fall? 0  Risk for fall due to : No Fall Risks  Follow up Falls evaluation completed     Most recent depression screenings:    09/25/2021    2:31 PM 07/12/2021   11:37 AM  PHQ 2/9 Scores  PHQ - 2 Score 0 5  PHQ- 9 Score 0 12     Patient Active Problem List   Diagnosis Date Noted   Acute respiratory failure with hypoxia (HCC) 07/12/2021   Type 2 diabetes mellitus without complication, without long-term current use of insulin (HCC) 04/05/2019   COVID-19 07/03/2018   Rhinitis, allergic 05/22/2015   Past Medical History:  Diagnosis Date   Diabetes mellitus without complication (HCC)    No pertinent past medical history    Past Surgical History:  Procedure Laterality Date   NO PAST SURGERIES     Social History   Tobacco Use   Smoking status: Never   Smokeless tobacco: Never  Vaping Use   Vaping Use: Never used  Substance Use Topics   Alcohol use: Not Currently    Comment: socially   Drug use: Never   Social History   Socioeconomic History   Marital status: Married    Spouse name: Not on file   Number of children: 3   Years of education: Not on file   Highest education level: Not on file  Occupational History   Not on file   Tobacco Use   Smoking status: Never   Smokeless tobacco: Never  Vaping Use   Vaping Use: Never used  Substance and Sexual Activity   Alcohol use: Not Currently    Comment: socially   Drug use: Never   Sexual activity: Not on file  Other Topics Concern   Not on file  Social History Narrative   Married with 3 children   Works Financial trader   Social Determinants of Health   Financial Resource Strain: Not on file  Food Insecurity: Not on file  Transportation Needs: Not on file  Physical Activity: Not on file  Stress: Not on file  Social Connections: Not on file  Intimate Partner Violence: Not on file   Family Status  Relation Name Status   Mother  Deceased   Father  Deceased   Neg Hx  (Not Specified)   Family History  Problem Relation Age of Onset   Hypertension Neg Hx    Heart disease Neg Hx    Cancer Neg Hx    AAA (abdominal aortic aneurysm) Neg Hx    No Known Allergies   Patient Care Team: Janeece Agee, NP as PCP - General (Adult Health Nurse Practitioner)  Medications: Outpatient Medications Prior to Visit  Medication Sig   cyclobenzaprine (FLEXERIL) 10 MG tablet Take 1 tablet (10 mg total) by mouth 3 (three) times daily as needed for muscle spasms.   diazepam (VALIUM) 2 MG tablet Take 1 tablet (2 mg total) by mouth at bedtime as needed for anxiety, muscle spasms or sedation.   diclofenac (VOLTAREN) 75 MG EC tablet Take 1 tablet (75 mg total) by mouth 2 (two) times daily.   FLUoxetine (PROZAC) 20 MG capsule Take 1 capsule (20 mg total) by mouth daily.   lidocaine (LIDODERM) 5 % Place 1 patch onto the skin daily. Remove & Discard patch within 12 hours or as directed by MD   meloxicam (MOBIC) 7.5 MG tablet Take 1 tablet (7.5 mg total) by mouth 2 (two) times daily as needed for pain.   mirtazapine (REMERON) 15 MG tablet Take 1 tablet (15 mg total) by mouth at bedtime.   Multiple Vitamin (MULTIVITAMIN) tablet Take 1 tablet by mouth daily.    naproxen (NAPROSYN) 500 MG tablet Take 1 tablet (500 mg total) by mouth 2 (two) times daily.   No facility-administered medications prior to visit.    Review of Systems  Constitutional: Negative.   HENT: Negative.    Eyes: Negative.   Respiratory: Negative.    Cardiovascular: Negative.   Gastrointestinal: Negative.   Genitourinary: Negative.   Musculoskeletal: Negative.   Skin: Negative.   Neurological: Negative.   Psychiatric/Behavioral: Negative.    All other systems reviewed and are negative.   Last CBC Lab Results  Component Value Date   WBC 4.6 03/17/2020   HGB 17.0 03/17/2020   HCT 49.5 03/17/2020   MCV 94 03/17/2020   MCH 32.2 03/17/2020   RDW 12.1 03/17/2020   PLT 220 04/05/2019   Last metabolic panel Lab Results  Component Value Date   GLUCOSE 81 03/21/2021   NA 138 03/21/2021   K 4.0 03/21/2021   CL 104 03/21/2021   CO2 25 03/21/2021   BUN 15 03/21/2021   CREATININE 0.93 03/21/2021   GFRNONAA 88 03/17/2020   CALCIUM 9.5 03/21/2021   PROT 7.9 03/21/2021   ALBUMIN 4.6 03/21/2021   LABGLOB 3.4 03/17/2020   AGRATIO 1.4 03/17/2020   BILITOT 0.8 03/21/2021   ALKPHOS 30 (L) 03/21/2021   AST 21 03/21/2021   ALT 17 03/21/2021   ANIONGAP 10 07/07/2018   Last lipids Lab Results  Component Value Date   CHOL 166 03/21/2021   HDL 59.60 03/21/2021   LDLCALC 97 03/21/2021   TRIG 43.0 03/21/2021   CHOLHDL 3 03/21/2021   Last hemoglobin A1c Lab Results  Component Value Date   HGBA1C 5.3 03/21/2021   Last thyroid functions Lab Results  Component Value Date   TSH 1.54 09/13/2020   Last vitamin D Lab Results  Component Value Date   VD25OH 29.48 (L) 03/21/2021   Last vitamin B12 and Folate Lab Results  Component Value Date   VITAMINB12 665 09/13/2020        Objective:     BP 110/78   Pulse 77   Temp 98 F (36.7 C) (Temporal)   Resp 18   Ht 5\' 7"  (1.702 m)   Wt 162 lb 3.2 oz (73.6 kg)   SpO2 99%   BMI 25.40 kg/m   BP Readings from  Last 3 Encounters:  09/25/21 110/78  07/12/21 103/79  07/05/21 120/76   Wt Readings from Last 3 Encounters:  09/25/21 162 lb 3.2 oz (73.6 kg)  07/12/21  164 lb (74.4 kg)  07/05/21 159 lb (72.1 kg)   SpO2 Readings from Last 3 Encounters:  09/25/21 99%  07/12/21 98%  07/05/21 98%      Physical Exam Vitals and nursing note reviewed.  Constitutional:      General: He is not in acute distress.    Appearance: Normal appearance. He is normal weight. He is not ill-appearing, toxic-appearing or diaphoretic.  HENT:     Head: Normocephalic and atraumatic.     Right Ear: Tympanic membrane, ear canal and external ear normal. There is no impacted cerumen.     Left Ear: Tympanic membrane, ear canal and external ear normal. There is no impacted cerumen.     Nose: Nose normal. No congestion or rhinorrhea.     Mouth/Throat:     Mouth: Mucous membranes are moist.     Pharynx: Oropharynx is clear. No oropharyngeal exudate or posterior oropharyngeal erythema.  Eyes:     General: No scleral icterus.       Right eye: No discharge.        Left eye: No discharge.     Extraocular Movements: Extraocular movements intact.     Conjunctiva/sclera: Conjunctivae normal.     Pupils: Pupils are equal, round, and reactive to light.  Neck:     Vascular: No carotid bruit.  Cardiovascular:     Rate and Rhythm: Normal rate and regular rhythm.     Pulses: Normal pulses.     Heart sounds: Normal heart sounds. No murmur heard.    No friction rub. No gallop.  Pulmonary:     Effort: Pulmonary effort is normal. No respiratory distress.     Breath sounds: Normal breath sounds. No stridor. No wheezing, rhonchi or rales.  Chest:     Chest wall: No tenderness.  Abdominal:     General: Abdomen is flat. Bowel sounds are normal. There is no distension.     Palpations: Abdomen is soft. There is no mass.     Tenderness: There is no abdominal tenderness. There is no right CVA tenderness, left CVA tenderness, guarding or  rebound.     Hernia: No hernia is present.  Musculoskeletal:        General: No swelling, tenderness, deformity or signs of injury. Normal range of motion.     Cervical back: Normal range of motion and neck supple. No rigidity or tenderness.     Right lower leg: No edema.     Left lower leg: No edema.  Lymphadenopathy:     Cervical: No cervical adenopathy.  Skin:    General: Skin is warm and dry.     Capillary Refill: Capillary refill takes less than 2 seconds.     Coloration: Skin is not jaundiced or pale.     Findings: No bruising, erythema, lesion or rash.  Neurological:     General: No focal deficit present.     Mental Status: He is alert and oriented to person, place, and time. Mental status is at baseline.     Cranial Nerves: No cranial nerve deficit.     Sensory: No sensory deficit.     Motor: No weakness.     Coordination: Coordination normal.     Gait: Gait normal.     Deep Tendon Reflexes: Reflexes normal.  Psychiatric:        Mood and Affect: Mood normal.        Behavior: Behavior normal.        Thought Content: Thought content normal.  Judgment: Judgment normal.      No results found for any visits on 09/25/21.    Assessment & Plan:    Routine Health Maintenance and Physical Exam  Immunization History  Administered Date(s) Administered   PFIZER(Purple Top)SARS-COV-2 Vaccination 10/04/2019, 10/25/2019   Tdap 12/01/2015    Health Maintenance  Topic Date Due   FOOT EXAM  Never done   OPHTHALMOLOGY EXAM  Never done   COLONOSCOPY (Pts 45-32yrs Insurance coverage will need to be confirmed)  Never done   Zoster Vaccines- Shingrix (1 of 2) Never done   COVID-19 Vaccine (3 - Pfizer series) 12/20/2019   URINE MICROALBUMIN  03/17/2021   HEMOGLOBIN A1C  09/18/2021   INFLUENZA VACCINE  10/09/2021   TETANUS/TDAP  11/30/2025   Hepatitis C Screening  Completed   HIV Screening  Completed   HPV VACCINES  Aged Out    Discussed health benefits of physical  activity, and encouraged him to engage in regular exercise appropriate for his age and condition.  Problem List Items Addressed This Visit   None  No follow-ups on file.     PLAN Exam unremarkable Labs collected. Will follow up with the patient as warranted. Refill all meds Patient encouraged to call clinic with any questions, comments, or concerns.   Janeece Agee, NP

## 2021-09-25 NOTE — Patient Instructions (Addendum)
Javier [name] -   It has been my honor to take part in your care. It was truly a blessing to have met you.  I know we've established a good relationship - I want to make sure you have a provider who you can also have a good relationship with.  Check out these names below:  Javier [name], Javier Berniece Pap, MD Javier Blazer Early, NP Javier Ruths, DNP  I hope you nothing but the best,  Javier [name]    If you have lab work done today you will be contacted with your lab results within the next 2 weeks.  If you have not heard from [place] then please contact us. The fastest way to get your results is to register for My Chart.   IF you received an x-ray today, you will receive an invoice from [facility]. Please contact [facility] at [phone] with questions or concerns regarding your invoice.   IF you received labwork today, you will receive an invoice from [facility]. Please contact [facility] at [phone] with questions or concerns regarding your invoice.   Our billing staff will not be able to assist you with questions regarding bills from these companies.  You will be contacted with the lab results as soon as they are available. The fastest way to get your results is to activate your My Chart account. Instructions are located on the last page of this paperwork. If you have not heard from [place] regarding the results in 2 weeks, please contact this office.

## 2021-09-26 LAB — COMPREHENSIVE METABOLIC PANEL
ALT: 13 U/L (ref 0–53)
AST: 16 U/L (ref 0–37)
Albumin: 4.5 g/dL (ref 3.5–5.2)
Alkaline Phosphatase: 29 U/L — ABNORMAL LOW (ref 39–117)
BUN: 15 mg/dL (ref 6–23)
CO2: 25 mEq/L (ref 19–32)
Calcium: 9.3 mg/dL (ref 8.4–10.5)
Chloride: 106 mEq/L (ref 96–112)
Creatinine, Ser: 0.9 mg/dL (ref 0.40–1.50)
GFR: 98.17 mL/min (ref >60.00)
Glucose, Bld: 102 mg/dL — ABNORMAL HIGH (ref 70–99)
Potassium: 4.1 mEq/L (ref 3.5–5.1)
Sodium: 140 mEq/L (ref 135–145)
Total Bilirubin: 0.8 mg/dL (ref 0.2–1.2)
Total Protein: 7.4 g/dL (ref 6.0–8.3)

## 2021-09-26 LAB — CBC WITH DIFFERENTIAL/PLATELET
Basophils Absolute: 0 10*3/uL (ref 0.0–0.1)
Basophils Relative: 0.5 % (ref 0.0–3.0)
Eosinophils Absolute: 0.1 10*3/uL (ref 0.0–0.7)
Eosinophils Relative: 1.6 % (ref 0.0–5.0)
HCT: 46.5 % (ref 39.0–52.0)
Hemoglobin: 15.6 g/dL (ref 13.0–17.0)
Lymphocytes Relative: 40.1 % (ref 12.0–46.0)
Lymphs Abs: 1.7 10*3/uL (ref 0.7–4.0)
MCHC: 33.5 g/dL (ref 30.0–36.0)
MCV: 98 fl (ref 78.0–100.0)
Monocytes Absolute: 0.5 10*3/uL (ref 0.1–1.0)
Monocytes Relative: 10.9 % (ref 3.0–12.0)
Neutro Abs: 2 10*3/uL (ref 1.4–7.7)
Neutrophils Relative %: 46.9 % (ref 43.0–77.0)
Platelets: 180 10*3/uL (ref 150.0–400.0)
RBC: 4.74 Mil/uL (ref 4.22–5.81)
RDW: 13.1 % (ref 11.5–15.5)
WBC: 4.2 10*3/uL (ref 4.0–10.5)

## 2021-09-26 LAB — LIPID PANEL
Cholesterol: 172 mg/dL (ref 0–200)
HDL: 53.4 mg/dL (ref >39.00)
LDL Cholesterol: 109 mg/dL — ABNORMAL HIGH (ref 0–99)
NonHDL: 118.14
Total CHOL/HDL Ratio: 3
Triglycerides: 48 mg/dL (ref 0.0–149.0)
VLDL: 9.6 mg/dL (ref 0.0–40.0)

## 2021-09-26 LAB — MICROALBUMIN / CREATININE URINE RATIO
Creatinine,U: 157.2 mg/dL
Microalb Creat Ratio: 0.4 mg/g (ref 0.0–30.0)
Microalb, Ur: <0.7 mg/dL (ref 0.0–1.9)

## 2021-09-26 LAB — HEMOGLOBIN A1C: Hgb A1c MFr Bld: 5.7 % (ref 4.6–6.5)

## 2021-09-26 LAB — PSA: PSA: 0.56 ng/mL (ref 0.10–4.00)

## 2021-09-26 LAB — TSH: TSH: 1.37 u[IU]/mL (ref 0.35–5.50)

## 2021-10-15 NOTE — Progress Notes (Signed)
Javier [name] is a 51 y.o. male here for a {New prob or follow up:31724}.  SCRIBE STATEMENT  History of Present Illness:   No chief complaint on file.   HPI Back Pain   Past Medical History:  Diagnosis Date   Diabetes mellitus without complication (HCC)    No pertinent past medical history      Social History   Tobacco Use   Smoking status: Never   Smokeless tobacco: Never  Vaping Use   Vaping Use: Never used  Substance Use Topics   Alcohol use: Not Currently    Comment: socially   Drug use: Never    Past Surgical History:  Procedure Laterality Date   NO PAST SURGERIES      Family History  Problem Relation Age of Onset   Hypertension Neg Hx    Heart disease Neg Hx    Cancer Neg Hx    AAA (abdominal aortic aneurysm) Neg Hx     No Known Allergies  Current Medications:   Current Outpatient Medications:    cyclobenzaprine (FLEXERIL) 10 MG tablet, Take 1 tablet (10 mg total) by mouth 3 (three) times daily as needed for muscle spasms., Disp: 30 tablet, Rfl: 0   diazepam (VALIUM) 2 MG tablet, Take 1 tablet (2 mg total) by mouth at bedtime as needed for anxiety, muscle spasms or sedation., Disp: 30 tablet, Rfl: 0   diclofenac (VOLTAREN) 75 MG EC tablet, Take 1 tablet (75 mg total) by mouth 2 (two) times daily., Disp: 30 tablet, Rfl: 0   FLUoxetine (PROZAC) 20 MG capsule, Take 1 capsule (20 mg total) by mouth daily., Disp: 90 capsule, Rfl: 3   lidocaine (LIDODERM) 5 %, Place 1 patch onto the skin daily. Remove & Discard patch within 12 hours or as directed by MD, Disp: 30 patch, Rfl: 0   meloxicam (MOBIC) 7.5 MG tablet, Take 1 tablet (7.5 mg total) by mouth 2 (two) times daily as needed for pain., Disp: 30 tablet, Rfl: 2   mirtazapine (REMERON) 15 MG tablet, Take 1 tablet (15 mg total) by mouth at bedtime., Disp: 90 tablet, Rfl: 1   Multiple Vitamin (MULTIVITAMIN) tablet, Take 1 tablet by mouth daily., Disp: , Rfl:    naproxen (NAPROSYN) 500 MG tablet, Take 1 tablet  (500 mg total) by mouth 2 (two) times daily., Disp: 30 tablet, Rfl: 0   sildenafil (VIAGRA) 100 MG tablet, Take 0.5-1 tablets (50-100 mg total) by mouth daily as needed for erectile dysfunction., Disp: 30 tablet, Rfl: 11   Review of Systems:   ROS Negative unless otherwise specified per HPI.   Vitals:   There were no vitals filed for this visit.   There is no height or weight on file to calculate BMI.  Physical Exam:   Physical Exam  Assessment and Plan:   @DIAGLIST @    I,Savera Zaman,acting as a scribe for , PA.,have documented all relevant documentation on the behalf of [employer], PA,as directed by  Jarold Motto, PA while in the presence of Jarold Motto, Jarold Motto.   ***  [place], PA-C

## 2021-10-16 ENCOUNTER — Encounter: Payer: Self-pay | Admitting: Physician Assistant

## 2021-10-16 ENCOUNTER — Ambulatory Visit (INDEPENDENT_AMBULATORY_CARE_PROVIDER_SITE_OTHER): Payer: Commercial Managed Care - HMO | Admitting: Physician Assistant

## 2021-10-16 ENCOUNTER — Ambulatory Visit (INDEPENDENT_AMBULATORY_CARE_PROVIDER_SITE_OTHER)
Admission: RE | Admit: 2021-10-16 | Discharge: 2021-10-16 | Disposition: A | Discharge status: Home / Self Care | Payer: Commercial Managed Care - HMO | Source: Ambulatory Visit | Attending: Physician Assistant | Admitting: Physician Assistant

## 2021-10-16 VITALS — BP 108/70 | HR 73 | Temp 98.5°F | Ht 67.0 in | Wt 157.0 lb

## 2021-10-16 DIAGNOSIS — M545 Low back pain, unspecified: Secondary | ICD-10-CM

## 2021-10-16 DIAGNOSIS — Z1159 Encounter for screening for other viral diseases: Secondary | ICD-10-CM

## 2021-10-16 DIAGNOSIS — M79662 Pain in left lower leg: Secondary | ICD-10-CM

## 2021-10-16 DIAGNOSIS — R634 Abnormal weight loss: Secondary | ICD-10-CM | POA: Diagnosis not present

## 2021-10-16 DIAGNOSIS — Z114 Encounter for screening for human immunodeficiency virus [HIV]: Secondary | ICD-10-CM

## 2021-10-16 LAB — COMPREHENSIVE METABOLIC PANEL
ALT: 12 U/L (ref 0–53)
AST: 15 U/L (ref 0–37)
Albumin: 4.4 g/dL (ref 3.5–5.2)
Alkaline Phosphatase: 28 U/L — ABNORMAL LOW (ref 39–117)
BUN: 16 mg/dL (ref 6–23)
CO2: 26 mEq/L (ref 19–32)
Calcium: 9.4 mg/dL (ref 8.4–10.5)
Chloride: 104 mEq/L (ref 96–112)
Creatinine, Ser: 0.97 mg/dL (ref 0.40–1.50)
GFR: 89.7 mL/min (ref >60.00)
Glucose, Bld: 96 mg/dL (ref 70–99)
Potassium: 4.9 mEq/L (ref 3.5–5.1)
Sodium: 137 mEq/L (ref 135–145)
Total Bilirubin: 0.5 mg/dL (ref 0.2–1.2)
Total Protein: 7.3 g/dL (ref 6.0–8.3)

## 2021-10-16 LAB — SEDIMENTATION RATE: Sed Rate: 29 mm/hr — ABNORMAL HIGH (ref 0–20)

## 2021-10-16 LAB — CBC WITH DIFFERENTIAL/PLATELET
Basophils Absolute: 0.1 10*3/uL (ref 0.0–0.1)
Basophils Relative: 1.3 % (ref 0.0–3.0)
Eosinophils Absolute: 0.2 10*3/uL (ref 0.0–0.7)
Eosinophils Relative: 4.2 % (ref 0.0–5.0)
HCT: 46.7 % (ref 39.0–52.0)
Hemoglobin: 15.7 g/dL (ref 13.0–17.0)
Lymphocytes Relative: 48.4 % — ABNORMAL HIGH (ref 12.0–46.0)
Lymphs Abs: 1.9 10*3/uL (ref 0.7–4.0)
MCHC: 33.5 g/dL (ref 30.0–36.0)
MCV: 97.7 fl (ref 78.0–100.0)
Monocytes Absolute: 0.5 10*3/uL (ref 0.1–1.0)
Monocytes Relative: 12.3 % — ABNORMAL HIGH (ref 3.0–12.0)
Neutro Abs: 1.3 10*3/uL — ABNORMAL LOW (ref 1.4–7.7)
Neutrophils Relative %: 33.8 % — ABNORMAL LOW (ref 43.0–77.0)
Platelets: 181 10*3/uL (ref 150.0–400.0)
RBC: 4.78 Mil/uL (ref 4.22–5.81)
RDW: 13.3 % (ref 11.5–15.5)
WBC: 4 10*3/uL (ref 4.0–10.5)

## 2021-10-16 LAB — URINALYSIS, ROUTINE W REFLEX MICROSCOPIC
Bilirubin Urine: NEGATIVE
Ketones, ur: NEGATIVE
Leukocytes,Ua: NEGATIVE
Nitrite: NEGATIVE
Specific Gravity, Urine: 1.025 (ref 1.000–1.030)
Total Protein, Urine: NEGATIVE
Urine Glucose: NEGATIVE
Urobilinogen, UA: 0.2 (ref 0.0–1.0)
WBC, UA: NONE SEEN (ref 0–?)
pH: 6 (ref 5.0–8.0)

## 2021-10-16 LAB — C-REACTIVE PROTEIN: CRP: <1 mg/dL (ref 0.5–20.0)

## 2021-10-16 MED ORDER — DICLOFENAC SODIUM 75 MG PO TBEC: 75.0000 mg | DELAYED_RELEASE_TABLET | Freq: Two times a day (BID) | ORAL | 0 refills | Status: DC

## 2021-10-16 NOTE — Patient Instructions (Addendum)
It was great to see you!  Work on adding in more protein with your meals.  Milk is ok -- drink 8 oz at a time.  An order for an xray has been put in for you. To get your xray, you can walk in at the [facility] location without a scheduled appointment.  The address is [address] It is across the street from [facility]. X-ray is located in the basement.  Hours of operation are M-F 8:30am to 5:00pm. Please note that they are closed for lunch between 12:30 and 1:00pm.  For your back, please start the oral diclofenac. May take twice daily. May also trial flexeril that you already have.  Let's follow-up in 1 month, sooner if you have concerns.  If blood work, urine studies, or any imaging was ordered today -->  we will release your results to you on your MyChart account (if you have chosen to sign up for this) with further instructions. You may see the results before I do, but when I review them I will send you a message with my report or have my staff call you if things need to be discussed. Please reply to my message with any questions.   Take care,  Jarold Motto PA-C

## 2021-10-17 LAB — FECAL OCCULT BLOOD, IMMUNOCHEMICAL: Fecal Occult Bld: NEGATIVE

## 2021-10-17 LAB — HEPATITIS C ANTIBODY: Hepatitis C Ab: NONREACTIVE

## 2021-10-17 LAB — HIV ANTIBODY (ROUTINE TESTING W REFLEX): HIV 1&2 Ab, 4th Generation: NONREACTIVE

## 2021-11-16 ENCOUNTER — Encounter: Payer: Self-pay | Admitting: Physician Assistant

## 2021-11-16 ENCOUNTER — Ambulatory Visit (INDEPENDENT_AMBULATORY_CARE_PROVIDER_SITE_OTHER): Payer: Commercial Managed Care - HMO | Admitting: Physician Assistant

## 2021-11-16 VITALS — BP 104/70 | HR 80 | Temp 98.1°F | Ht 67.0 in | Wt 160.2 lb

## 2021-11-16 DIAGNOSIS — R634 Abnormal weight loss: Secondary | ICD-10-CM

## 2021-11-16 DIAGNOSIS — E119 Type 2 diabetes mellitus without complications: Secondary | ICD-10-CM

## 2021-11-16 NOTE — Progress Notes (Signed)
Javier [name] is a 51 y.o. male here for a follow up of a pre-existing problem.  History of Present Illness:   Chief Complaint  Patient presents with   Transfer of Care    1 month follow-up from unintentional weight loss    HPI  Unintentional Weight Loss He feels like his weight loss was likely related to over restricting of his diet.  He is very concerned about developing worsening diabetes.  He only eats 2 meals per day.  He has started adding milk back in.  He is up 3 pounds since he last saw me.  He feels like most of his symptoms are improved when he does eat more.  He denies any new symptoms since last seeing me.  Wt Readings from Last 4 Encounters:  11/16/21 160 lb 4 oz (72.7 kg)  10/16/21 157 lb (71.2 kg)  09/25/21 162 lb 3.2 oz (73.6 kg)  07/12/21 164 lb (74.4 kg)   Diabetes 2 month follow-up. Current DM meds: None. Blood sugars at home are: Not checked. Denies: hypoglycemic or hyperglycemic episodes or symptoms.   Lab Results  Component Value Date   HGBA1C 5.7 09/25/2021     Past Medical History:  Diagnosis Date   Diabetes mellitus without complication (HCC)    No pertinent past medical history      Social History   Tobacco Use   Smoking status: Never   Smokeless tobacco: Never  Vaping Use   Vaping Use: Never used  Substance Use Topics   Alcohol use: Not Currently    Comment: socially   Drug use: Never    Past Surgical History:  Procedure Laterality Date   NO PAST SURGERIES      Family History  Problem Relation Age of Onset   Hypertension Neg Hx    Heart disease Neg Hx    Cancer Neg Hx    AAA (abdominal aortic aneurysm) Neg Hx     No Known Allergies  Current Medications:   Current Outpatient Medications:    diclofenac (VOLTAREN) 75 MG EC tablet, Take 1 tablet (75 mg total) by mouth 2 (two) times daily., Disp: 30 tablet, Rfl: 0   FLUoxetine (PROZAC) 20 MG capsule, Take 1 capsule (20 mg total) by mouth daily., Disp: 90 capsule, Rfl: 3    sildenafil (VIAGRA) 100 MG tablet, Take 0.5-1 tablets (50-100 mg total) by mouth daily as needed for erectile dysfunction., Disp: 30 tablet, Rfl: 11   cyclobenzaprine (FLEXERIL) 10 MG tablet, Take 1 tablet (10 mg total) by mouth 3 (three) times daily as needed for muscle spasms. (Patient not taking: Reported on 11/16/2021), Disp: 30 tablet, Rfl: 0   diazepam (VALIUM) 2 MG tablet, Take 1 tablet (2 mg total) by mouth at bedtime as needed for anxiety, muscle spasms or sedation. (Patient not taking: Reported on 10/16/2021), Disp: 30 tablet, Rfl: 0   mirtazapine (REMERON) 15 MG tablet, Take 1 tablet (15 mg total) by mouth at bedtime. (Patient not taking: Reported on 10/16/2021), Disp: 90 tablet, Rfl: 1   Review of Systems:   ROS Negative unless otherwise specified per HPI.   Vitals:   Vitals:   11/16/21 0949  BP: 104/70  Pulse: 80  Temp: 98.1 F (36.7 C)  TempSrc: Temporal  SpO2: 98%  Weight: 160 lb 4 oz (72.7 kg)  Height: 5\' 7"  (1.702 m)     Body mass index is 25.1 kg/m.  Physical Exam:   Physical Exam Vitals and nursing note reviewed.  Constitutional:  General: He is not in acute distress.    Appearance: He is well-developed. He is not ill-appearing or toxic-appearing.  Cardiovascular:     Rate and Rhythm: Normal rate and regular rhythm.     Pulses: Normal pulses.     Heart sounds: Normal heart sounds, S1 normal and S2 normal.  Pulmonary:     Effort: Pulmonary effort is normal.     Breath sounds: Normal breath sounds.  Skin:    General: Skin is warm and dry.  Neurological:     Mental Status: He is alert.     GCS: GCS eye subscore is 4. GCS verbal subscore is 5. GCS motor subscore is 6.  Psychiatric:        Speech: Speech normal.        Behavior: Behavior normal. Behavior is cooperative.     Assessment and Plan:   Unintentional weight loss Weight has stabilized  Provided dietary education on balancing out meals with the ability to have appropriate intake of  carbohydrates Offered referral to dietitian but he declined at this time We will follow-up in 2 months when he is due to check his A1c  Type 2 diabetes mellitus without complication, without long-term current use of insulin (HCC) Suspect remains well controlled Too soon to recheck his A1c today Follow-up in 2 months to recheck see  Jarold Motto, PA-C

## 2021-11-16 NOTE — Patient Instructions (Addendum)
It was great to see you!  Follow-up for A1c October 16th and we will also recheck your CBC counts at that time  Continue to work on adding in more protein  Take care,  Jarold Motto PA-C

## 2021-12-24 ENCOUNTER — Encounter: Payer: Self-pay | Admitting: Physician Assistant

## 2021-12-24 ENCOUNTER — Ambulatory Visit (INDEPENDENT_AMBULATORY_CARE_PROVIDER_SITE_OTHER): Payer: Commercial Managed Care - HMO | Admitting: Physician Assistant

## 2021-12-24 VITALS — BP 100/60 | HR 67 | Temp 98.2°F | Ht 67.0 in | Wt 165.2 lb

## 2021-12-24 DIAGNOSIS — R634 Abnormal weight loss: Secondary | ICD-10-CM | POA: Diagnosis not present

## 2021-12-24 DIAGNOSIS — E559 Vitamin D deficiency, unspecified: Secondary | ICD-10-CM | POA: Diagnosis not present

## 2021-12-24 DIAGNOSIS — H04123 Dry eye syndrome of bilateral lacrimal glands: Secondary | ICD-10-CM | POA: Diagnosis not present

## 2021-12-24 DIAGNOSIS — E119 Type 2 diabetes mellitus without complications: Secondary | ICD-10-CM

## 2021-12-24 LAB — CBC WITH DIFFERENTIAL/PLATELET
Basophils Absolute: 0 10*3/uL (ref 0.0–0.1)
Basophils Relative: 0.7 % (ref 0.0–3.0)
Eosinophils Absolute: 0.1 10*3/uL (ref 0.0–0.7)
Eosinophils Relative: 2.2 % (ref 0.0–5.0)
HCT: 45.2 % (ref 39.0–52.0)
Hemoglobin: 15.2 g/dL (ref 13.0–17.0)
Lymphocytes Relative: 44.1 % (ref 12.0–46.0)
Lymphs Abs: 2.5 10*3/uL (ref 0.7–4.0)
MCHC: 33.6 g/dL (ref 30.0–36.0)
MCV: 95.8 fl (ref 78.0–100.0)
Monocytes Absolute: 0.7 10*3/uL (ref 0.1–1.0)
Monocytes Relative: 11.7 % (ref 3.0–12.0)
Neutro Abs: 2.3 10*3/uL (ref 1.4–7.7)
Neutrophils Relative %: 41.3 % — ABNORMAL LOW (ref 43.0–77.0)
Platelets: 192 10*3/uL (ref 150.0–400.0)
RBC: 4.72 Mil/uL (ref 4.22–5.81)
RDW: 12.6 % (ref 11.5–15.5)
WBC: 5.6 10*3/uL (ref 4.0–10.5)

## 2021-12-24 LAB — HEMOGLOBIN A1C: Hgb A1c MFr Bld: 6 % (ref 4.6–6.5)

## 2021-12-24 LAB — VITAMIN D 25 HYDROXY (VIT D DEFICIENCY, FRACTURES): VITD: 24.5 ng/mL — ABNORMAL LOW (ref 30.00–100.00)

## 2021-12-24 NOTE — Patient Instructions (Signed)
It was great to see you!  Review handouts for additional ideas for eating healthy.  We will update blood work today.  Please start using your eyedrops.  Follow-up in 3 months.  Take care,  Inda Coke PA-C

## 2021-12-24 NOTE — Progress Notes (Signed)
Javier [name] is a 51 y.o. male here for a follow up of a pre-existing problem.  History of Present Illness:   Chief Complaint  Patient presents with   Weight Loss    HPI  Diabetes; Unintentional weight loss He reports that he is not consistent with eating 3 meals a day.  He reports that he has been having issues trying to eat more due to his concerns of developing worsening diabetes.  He has gained about 5 pounds since he was last here a month ago.  He denies any issues with numbness or tingling in fingers or toes.  He is currently not on any medications.  Vitamin D deficiency He would like his vitamin D tested today.  Erythematous and dry eyes Patient reports that he has noticed that his eyes are red at times.  He has prescription eyedrops for lubrication but does not use them.  He denies pain, vision changes, excessive tearing or discharge from eyes.    Past Medical History:  Diagnosis Date   Diabetes mellitus without complication (HCC)    No pertinent past medical history      Social History   Tobacco Use   Smoking status: Never   Smokeless tobacco: Never  Vaping Use   Vaping Use: Never used  Substance Use Topics   Alcohol use: Not Currently    Comment: socially   Drug use: Never    Past Surgical History:  Procedure Laterality Date   NO PAST SURGERIES      Family History  Problem Relation Age of Onset   Hypertension Neg Hx    Heart disease Neg Hx    Cancer Neg Hx    AAA (abdominal aortic aneurysm) Neg Hx     No Known Allergies  Current Medications:   Current Outpatient Medications:    sildenafil (VIAGRA) 100 MG tablet, Take 0.5-1 tablets (50-100 mg total) by mouth daily as needed for erectile dysfunction., Disp: 30 tablet, Rfl: 11   Review of Systems:   Review of Systems  Eyes:  Positive for redness (with dryness). Negative for pain.  Negative unless otherwise specified per HPI.   Vitals:   Vitals:   12/24/21 1107  BP: 100/60  Pulse: 67   Temp: 98.2 F (36.8 C)  TempSrc: Temporal  SpO2: 98%  Weight: 165 lb 4 oz (75 kg)  Height: 5\' 7"  (1.702 m)     Body mass index is 25.88 kg/m.  Physical Exam:   Physical Exam Constitutional:      General: He is not in acute distress.    Appearance: Normal appearance. He is not ill-appearing.  HENT:     Head: Normocephalic and atraumatic.     Right Ear: External ear normal.     Left Ear: External ear normal.  Eyes:     Extraocular Movements: Extraocular movements intact.     Pupils: Pupils are equal, round, and reactive to light.  Cardiovascular:     Rate and Rhythm: Normal rate and regular rhythm.     Heart sounds: Normal heart sounds. No murmur heard.    No gallop.  Pulmonary:     Effort: Pulmonary effort is normal. No respiratory distress.     Breath sounds: Normal breath sounds. No wheezing or rales.  Skin:    General: Skin is warm and dry.  Neurological:     Mental Status: He is alert and oriented to person, place, and time.  Psychiatric:        Judgment: Judgment normal.  Assessment and Plan:   Unintentional weight loss Weight has stabilized Continue to work on healthy ways to increase calories Provided handouts Follow-up in 3-6 months, sooner if concerns  Type 2 diabetes mellitus without complication, without long-term current use of insulin ([place]) Repeat A1c today and make recommendations accordingly  Vitamin D deficiency Update vitamin D today and make recommendations accordingly  Dry eyes Recommend that he start using his prescription eyedrops for dry eyes and follow-up with his optometrist if he has worsening symptoms or lack of improvement  I, Luna Glasgow,  as a Education administrator for [employer], PA.,have documented all relevant documentation on the behalf of Inda Coke, PA,as directed by  Inda Coke, PA while in the presence of Inda Coke, [place].  I, Inda Coke, [place], have reviewed all documentation for this visit. The documentation on  12/24/21 for the exam, diagnosis, procedures, and orders are all accurate and complete.   Inda Coke, PA-C

## 2022-01-20 LAB — HM DIABETES EYE EXAM

## 2022-02-08 LAB — HM DIABETES EYE EXAM

## 2022-03-26 ENCOUNTER — Ambulatory Visit: Payer: Commercial Managed Care - HMO | Admitting: Physician Assistant

## 2022-04-10 ENCOUNTER — Ambulatory Visit (INDEPENDENT_AMBULATORY_CARE_PROVIDER_SITE_OTHER): Payer: Commercial Managed Care - HMO | Admitting: Physician Assistant

## 2022-04-10 ENCOUNTER — Encounter: Payer: Self-pay | Admitting: Physician Assistant

## 2022-04-10 VITALS — BP 120/78 | HR 73 | Temp 97.7°F | Ht 67.0 in | Wt 164.4 lb

## 2022-04-10 DIAGNOSIS — E559 Vitamin D deficiency, unspecified: Secondary | ICD-10-CM

## 2022-04-10 DIAGNOSIS — R41840 Attention and concentration deficit: Secondary | ICD-10-CM

## 2022-04-10 DIAGNOSIS — E119 Type 2 diabetes mellitus without complications: Secondary | ICD-10-CM

## 2022-04-10 NOTE — Patient Instructions (Addendum)
It was great to see you!  Work on getting more sleep!  We will update your blood work today and I will be in touch with all results.  Let's follow-up in 3-6 months, sooner if you have concerns.  Take care,  Inda Coke PA-C

## 2022-04-10 NOTE — Progress Notes (Signed)
Javier [name] is a 52 y.o. male here for a follow-up of a pre-existing problem.  History of Present Illness:   Chief Complaint  Patient presents with   Diabetes   unintentional weight loss    Pt here for weight check     HPI  Attention Deficit Reports inadequate sleep recently, he is getting 5-6 hours or less per night consistently. He works at least two jobs. He reports having trouble with memory recall. He is having difficulty in getting 3 regular meals per day due for very busy work schedule Denies significant memory issues that impact his work.  Diabetes 3 month follow-up. Current DM meds: none. Blood sugars at home are: not checked. Patient is compliant with medications. Denies: hypoglycemic or hyperglycemic episodes or symptoms.   Lab Results  Component Value Date   HGBA1C 6.0 12/24/2021   Vitamin D Deficiency He is taking Vitamin D 10000 OTC daily with food.   Past Medical History:  Diagnosis Date   Diabetes mellitus without complication ([place])    No pertinent past medical history      Social History   Tobacco Use   Smoking status: Never   Smokeless tobacco: Never  Vaping Use   Vaping Use: Never used  Substance Use Topics   Alcohol use: Not Currently    Comment: socially   Drug use: Never    Past Surgical History:  Procedure Laterality Date   NO PAST SURGERIES      Family History  Problem Relation Age of Onset   Hypertension Neg Hx    Heart disease Neg Hx    Cancer Neg Hx    AAA (abdominal aortic aneurysm) Neg Hx     No Known Allergies  Current Medications:   Current Outpatient Medications:    sildenafil (VIAGRA) 100 MG tablet, Take 0.5-1 tablets (50-100 mg total) by mouth daily as needed for erectile dysfunction., Disp: 30 tablet, Rfl: 11   Review of Systems:   Review of Systems  Constitutional:  Negative for fever and malaise/fatigue.  HENT:  Negative for congestion.   Eyes:  Negative for blurred vision.  Respiratory:  Negative for  cough and shortness of breath.   Cardiovascular:  Negative for chest pain, palpitations and leg swelling.  Gastrointestinal:  Negative for vomiting.  Musculoskeletal:  Negative for back pain.  Skin:  Negative for rash.  Neurological:  Negative for loss of consciousness and headaches.    Vitals:   Vitals:   04/10/22 1341  BP: 120/78  Pulse: 73  Temp: 97.7 F (36.5 C)  TempSrc: Temporal  SpO2: 99%  Weight: 164 lb 6.1 oz (74.6 kg)  Height: 5\' 7"  (1.702 m)     Body mass index is 25.75 kg/m.  Physical Exam:   Physical Exam Vitals and nursing note reviewed.  Constitutional:      General: He is not in acute distress.    Appearance: He is well-developed. He is not ill-appearing or toxic-appearing.  Cardiovascular:     Rate and Rhythm: Normal rate and regular rhythm.     Pulses: Normal pulses.     Heart sounds: Normal heart sounds, S1 normal and S2 normal.  Pulmonary:     Effort: Pulmonary effort is normal.     Breath sounds: Normal breath sounds.  Skin:    General: Skin is warm and dry.  Neurological:     Mental Status: He is alert.     GCS: GCS eye subscore is 4. GCS verbal subscore is 5. GCS motor  subscore is 6.  Psychiatric:        Speech: Speech normal.        Behavior: Behavior normal. Behavior is cooperative.     Assessment and Plan:   Type 2 diabetes mellitus without complication, without long-term current use of insulin (HCC) Update A1c and provide recommendations accordingly  Attention or concentration deficit No red flags Recommend working on better sleep habits If symptoms persist after better sleep, close follow-up for further evaluation  Vitamin D deficiency Update Vit D and provide recommendations accordingly  I,Alexander Ruley,acting as a scribe for [employer], PA.,have documented all relevant documentation on the behalf of Inda Coke, PA,as directed by  Inda Coke, PA while in the presence of Inda Coke, [place].  I, Inda Coke, [place], have reviewed all documentation for this visit. The documentation on 04/10/22 for the exam, diagnosis, procedures, and orders are all accurate and complete.  Inda Coke, PA-C

## 2022-04-11 LAB — COMPREHENSIVE METABOLIC PANEL
ALT: 23 U/L (ref 0–53)
AST: 21 U/L (ref 0–37)
Albumin: 4.6 g/dL (ref 3.5–5.2)
Alkaline Phosphatase: 37 U/L — ABNORMAL LOW (ref 39–117)
BUN: 14 mg/dL (ref 6–23)
CO2: 25 mEq/L (ref 19–32)
Calcium: 9.6 mg/dL (ref 8.4–10.5)
Chloride: 105 mEq/L (ref 96–112)
Creatinine, Ser: 0.92 mg/dL (ref 0.40–1.50)
GFR: 95.25 mL/min (ref >60.00)
Glucose, Bld: 108 mg/dL — ABNORMAL HIGH (ref 70–99)
Potassium: 4.8 mEq/L (ref 3.5–5.1)
Sodium: 138 mEq/L (ref 135–145)
Total Bilirubin: 0.7 mg/dL (ref 0.2–1.2)
Total Protein: 7.6 g/dL (ref 6.0–8.3)

## 2022-04-11 LAB — VITAMIN D 25 HYDROXY (VIT D DEFICIENCY, FRACTURES): VITD: 56.94 ng/mL (ref 30.00–100.00)

## 2022-04-11 LAB — HEMOGLOBIN A1C: Hgb A1c MFr Bld: 5.7 % (ref 4.6–6.5)

## 2022-04-16 ENCOUNTER — Ambulatory Visit: Payer: Commercial Managed Care - HMO | Admitting: Physician Assistant

## 2022-05-03 ENCOUNTER — Encounter: Payer: Self-pay | Admitting: Physician Assistant

## 2022-08-20 NOTE — Progress Notes (Signed)
Javier [name] is a 52 y.o. male here for a new problem.  History of Present Illness:   No chief complaint on file.   HPI  Weight Loss    Past Medical History:  Diagnosis Date   Diabetes mellitus without complication (HCC)    No pertinent past medical history      Social History   Tobacco Use   Smoking status: Never   Smokeless tobacco: Never  Vaping Use   Vaping Use: Never used  Substance Use Topics   Alcohol use: Not Currently    Comment: socially   Drug use: Never    Past Surgical History:  Procedure Laterality Date   NO PAST SURGERIES      Family History  Problem Relation Age of Onset   Hypertension Neg Hx    Heart disease Neg Hx    Cancer Neg Hx    AAA (abdominal aortic aneurysm) Neg Hx     No Known Allergies  Current Medications:   Current Outpatient Medications:    sildenafil (VIAGRA) 100 MG tablet, Take 0.5-1 tablets (50-100 mg total) by mouth daily as needed for erectile dysfunction., Disp: 30 tablet, Rfl: 11   Review of Systems:   ROS  Vitals:   There were no vitals filed for this visit.   There is no height or weight on file to calculate BMI.  Physical Exam:   Physical Exam  Assessment and Plan:   ***   I,Alexander Ruley,acting as a scribe for Jarold Motto, PA.,have documented all relevant documentation on the behalf of Jarold Motto, PA,as directed by  Jarold Motto, PA while in the presence of Jarold Motto, [place].   ***   Jarold Motto, PA-C

## 2022-08-21 ENCOUNTER — Ambulatory Visit (INDEPENDENT_AMBULATORY_CARE_PROVIDER_SITE_OTHER): Payer: Commercial Managed Care - HMO | Admitting: Physician Assistant

## 2022-08-21 VITALS — BP 120/76 | HR 70 | Temp 98.7°F | Ht 67.0 in | Wt 163.0 lb

## 2022-08-21 DIAGNOSIS — F419 Anxiety disorder, unspecified: Secondary | ICD-10-CM | POA: Diagnosis not present

## 2022-08-21 DIAGNOSIS — E559 Vitamin D deficiency, unspecified: Secondary | ICD-10-CM

## 2022-08-21 DIAGNOSIS — L989 Disorder of the skin and subcutaneous tissue, unspecified: Secondary | ICD-10-CM

## 2022-08-21 DIAGNOSIS — E119 Type 2 diabetes mellitus without complications: Secondary | ICD-10-CM | POA: Diagnosis not present

## 2022-08-21 NOTE — Patient Instructions (Signed)
It was great to see you!  We will check on your face lesion next time you are here - we can cut this off at next visit if you'd like  Please work on consistent nutrition throughout the day  If you are going to walk 7 miles, please try to drink an extra protein drink that day!   Take care,  Jarold Motto PA-C

## 2022-08-22 LAB — COMPREHENSIVE METABOLIC PANEL
ALT: 14 U/L (ref 0–53)
AST: 16 U/L (ref 0–37)
Albumin: 4.4 g/dL (ref 3.5–5.2)
Alkaline Phosphatase: 30 U/L — ABNORMAL LOW (ref 39–117)
BUN: 18 mg/dL (ref 6–23)
CO2: 24 mEq/L (ref 19–32)
Calcium: 9.4 mg/dL (ref 8.4–10.5)
Chloride: 107 mEq/L (ref 96–112)
Creatinine, Ser: 0.89 mg/dL (ref 0.40–1.50)
GFR: 97.88 mL/min (ref >60.00)
Glucose, Bld: 106 mg/dL — ABNORMAL HIGH (ref 70–99)
Potassium: 4.2 mEq/L (ref 3.5–5.1)
Sodium: 138 mEq/L (ref 135–145)
Total Bilirubin: 0.8 mg/dL (ref 0.2–1.2)
Total Protein: 7.6 g/dL (ref 6.0–8.3)

## 2022-08-22 LAB — CBC WITH DIFFERENTIAL/PLATELET
Basophils Absolute: 0 10*3/uL (ref 0.0–0.1)
Basophils Relative: 0.8 % (ref 0.0–3.0)
Eosinophils Absolute: 0.1 10*3/uL (ref 0.0–0.7)
Eosinophils Relative: 3 % (ref 0.0–5.0)
HCT: 46.2 % (ref 39.0–52.0)
Hemoglobin: 15.5 g/dL (ref 13.0–17.0)
Lymphocytes Relative: 41.8 % (ref 12.0–46.0)
Lymphs Abs: 1.8 10*3/uL (ref 0.7–4.0)
MCHC: 33.6 g/dL (ref 30.0–36.0)
MCV: 96.4 fl (ref 78.0–100.0)
Monocytes Absolute: 0.5 10*3/uL (ref 0.1–1.0)
Monocytes Relative: 11.3 % (ref 3.0–12.0)
Neutro Abs: 1.8 10*3/uL (ref 1.4–7.7)
Neutrophils Relative %: 43.1 % (ref 43.0–77.0)
Platelets: 192 10*3/uL (ref 150.0–400.0)
RBC: 4.79 Mil/uL (ref 4.22–5.81)
RDW: 12.8 % (ref 11.5–15.5)
WBC: 4.2 10*3/uL (ref 4.0–10.5)

## 2022-08-22 LAB — HEMOGLOBIN A1C: Hgb A1c MFr Bld: 5.8 % (ref 4.6–6.5)

## 2022-08-22 LAB — VITAMIN D 25 HYDROXY (VIT D DEFICIENCY, FRACTURES): VITD: 28.53 ng/mL — ABNORMAL LOW (ref 30.00–100.00)
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
## 2023-02-24 NOTE — Progress Notes (Signed)
Javier [name] is a 52 y.o. male here for a follow up of a pre-existing problem.  History of Present Illness:   No chief complaint on file.   HPI  Type 2 Diabetes Mellitus    Past Medical History:  Diagnosis Date   Diabetes mellitus without complication (HCC)    No pertinent past medical history      Social History   Tobacco Use   Smoking status: Never   Smokeless tobacco: Never  Vaping Use   Vaping status: Never Used  Substance Use Topics   Alcohol use: Not Currently    Comment: socially   Drug use: Never    Past Surgical History:  Procedure Laterality Date   NO PAST SURGERIES      Family History  Problem Relation Age of Onset   Hypertension Neg Hx    Heart disease Neg Hx    Cancer Neg Hx    AAA (abdominal aortic aneurysm) Neg Hx     No Known Allergies  Current Medications:   Current Outpatient Medications:    sildenafil (VIAGRA) 100 MG tablet, Take 0.5-1 tablets (50-100 mg total) by mouth daily as needed for erectile dysfunction., Disp: 30 tablet, Rfl: 11   Review of Systems:   ROS  Vitals:   There were no vitals filed for this visit.   There is no height or weight on file to calculate BMI.  Physical Exam:   Physical Exam  Assessment and Plan:   ***   I,Alexander Ruley,acting as a scribe for Jarold Motto, PA.,have documented all relevant documentation on the behalf of Jarold Motto, PA,as directed by  Jarold Motto, PA while in the presence of Jarold Motto, [place].   ***  Jarold Motto, PA-C

## 2023-02-26 ENCOUNTER — Ambulatory Visit: Payer: Commercial Managed Care - HMO | Admitting: Physician Assistant

## 2023-02-26 VITALS — BP 126/70 | HR 91 | Temp 98.4°F | Ht 67.0 in | Wt 169.0 lb

## 2023-02-26 DIAGNOSIS — E559 Vitamin D deficiency, unspecified: Secondary | ICD-10-CM

## 2023-02-26 DIAGNOSIS — E119 Type 2 diabetes mellitus without complications: Secondary | ICD-10-CM

## 2023-02-26 DIAGNOSIS — N529 Male erectile dysfunction, unspecified: Secondary | ICD-10-CM

## 2023-02-26 DIAGNOSIS — F419 Anxiety disorder, unspecified: Secondary | ICD-10-CM | POA: Diagnosis not present

## 2023-02-26 MED ORDER — SILDENAFIL CITRATE 100 MG PO TABS: 50.0000 mg | ORAL_TABLET | Freq: Every day | ORAL | 1 refills | Status: DC | PRN

## 2023-02-26 NOTE — Patient Instructions (Addendum)
It was great to see you!  Have a great holiday!  Let's follow-up in 6 months for Comprehensive Physical Exam (CPE) preventive care annual visit, sooner if you have concerns.  Take care,  Jarold Motto PA-C

## 2023-02-27 LAB — COMPREHENSIVE METABOLIC PANEL
ALT: 24 U/L (ref 0–53)
AST: 17 U/L (ref 0–37)
Albumin: 4.3 g/dL (ref 3.5–5.2)
Alkaline Phosphatase: 33 U/L — ABNORMAL LOW (ref 39–117)
BUN: 13 mg/dL (ref 6–23)
CO2: 24 meq/L (ref 19–32)
Calcium: 9.1 mg/dL (ref 8.4–10.5)
Chloride: 105 meq/L (ref 96–112)
Creatinine, Ser: 0.89 mg/dL (ref 0.40–1.50)
GFR: 97.53 mL/min (ref >60.00)
Glucose, Bld: 120 mg/dL — ABNORMAL HIGH (ref 70–99)
Potassium: 4.1 meq/L (ref 3.5–5.1)
Sodium: 137 meq/L (ref 135–145)
Total Bilirubin: 0.8 mg/dL (ref 0.2–1.2)
Total Protein: 7.2 g/dL (ref 6.0–8.3)

## 2023-02-27 LAB — CBC WITH DIFFERENTIAL/PLATELET
Basophils Absolute: 0 10*3/uL (ref 0.0–0.1)
Basophils Relative: 0.9 % (ref 0.0–3.0)
Eosinophils Absolute: 0.1 10*3/uL (ref 0.0–0.7)
Eosinophils Relative: 3.1 % (ref 0.0–5.0)
HCT: 46.4 % (ref 39.0–52.0)
Hemoglobin: 15.6 g/dL (ref 13.0–17.0)
Lymphocytes Relative: 39.8 % (ref 12.0–46.0)
Lymphs Abs: 1.8 10*3/uL (ref 0.7–4.0)
MCHC: 33.5 g/dL (ref 30.0–36.0)
MCV: 97.6 fL (ref 78.0–100.0)
Monocytes Absolute: 0.6 10*3/uL (ref 0.1–1.0)
Monocytes Relative: 12 % (ref 3.0–12.0)
Neutro Abs: 2 10*3/uL (ref 1.4–7.7)
Neutrophils Relative %: 44.2 % (ref 43.0–77.0)
Platelets: 187 10*3/uL (ref 150.0–400.0)
RBC: 4.76 Mil/uL (ref 4.22–5.81)
RDW: 13.2 % (ref 11.5–15.5)
WBC: 4.6 10*3/uL (ref 4.0–10.5)

## 2023-02-27 LAB — HEMOGLOBIN A1C: Hgb A1c MFr Bld: 6.4 % (ref 4.6–6.5)

## 2023-02-27 LAB — PSA: PSA: 0.74 ng/mL (ref 0.10–4.00)

## 2023-02-27 LAB — MICROALBUMIN / CREATININE URINE RATIO
Creatinine,U: 167.3 mg/dL
Microalb Creat Ratio: 0.5 mg/g (ref 0.0–30.0)
Microalb, Ur: 0.8 mg/dL (ref 0.0–1.9)

## 2023-02-27 LAB — VITAMIN D 25 HYDROXY (VIT D DEFICIENCY, FRACTURES): VITD: 25.06 ng/mL — ABNORMAL LOW (ref 30.00–100.00)

## 2023-08-27 ENCOUNTER — Encounter: Payer: Commercial Managed Care - HMO | Admitting: Physician Assistant

## 2023-09-03 ENCOUNTER — Ambulatory Visit: Payer: Self-pay | Admitting: Physician Assistant

## 2023-09-03 ENCOUNTER — Ambulatory Visit: Admitting: Physician Assistant

## 2023-09-03 ENCOUNTER — Encounter: Payer: Self-pay | Admitting: Physician Assistant

## 2023-09-03 VITALS — BP 110/80 | HR 83 | Temp 98.8°F | Ht 67.0 in | Wt 161.0 lb

## 2023-09-03 DIAGNOSIS — D72819 Decreased white blood cell count, unspecified: Secondary | ICD-10-CM

## 2023-09-03 DIAGNOSIS — Z0000 Encounter for general adult medical examination without abnormal findings: Secondary | ICD-10-CM

## 2023-09-03 DIAGNOSIS — E559 Vitamin D deficiency, unspecified: Secondary | ICD-10-CM | POA: Diagnosis not present

## 2023-09-03 DIAGNOSIS — E119 Type 2 diabetes mellitus without complications: Secondary | ICD-10-CM | POA: Diagnosis not present

## 2023-09-03 DIAGNOSIS — N529 Male erectile dysfunction, unspecified: Secondary | ICD-10-CM

## 2023-09-03 DIAGNOSIS — Z23 Encounter for immunization: Secondary | ICD-10-CM | POA: Diagnosis not present

## 2023-09-03 DIAGNOSIS — Z125 Encounter for screening for malignant neoplasm of prostate: Secondary | ICD-10-CM

## 2023-09-03 LAB — COMPREHENSIVE METABOLIC PANEL WITH GFR
ALT: 16 U/L (ref 0–53)
AST: 20 U/L (ref 0–37)
Albumin: 4.3 g/dL (ref 3.5–5.2)
Alkaline Phosphatase: 30 U/L — ABNORMAL LOW (ref 39–117)
BUN: 16 mg/dL (ref 6–23)
CO2: 25 meq/L (ref 19–32)
Calcium: 9.1 mg/dL (ref 8.4–10.5)
Chloride: 107 meq/L (ref 96–112)
Creatinine, Ser: 0.9 mg/dL (ref 0.40–1.50)
GFR: 96.85 mL/min (ref >60.00)
Glucose, Bld: 114 mg/dL — ABNORMAL HIGH (ref 70–99)
Potassium: 4 meq/L (ref 3.5–5.1)
Sodium: 140 meq/L (ref 135–145)
Total Bilirubin: 0.7 mg/dL (ref 0.2–1.2)
Total Protein: 7.1 g/dL (ref 6.0–8.3)

## 2023-09-03 LAB — HEMOGLOBIN A1C: Hgb A1c MFr Bld: 6 % (ref 4.6–6.5)

## 2023-09-03 LAB — CBC WITH DIFFERENTIAL/PLATELET
Basophils Absolute: 0 10*3/uL (ref 0.0–0.1)
Basophils Relative: 1 % (ref 0.0–3.0)
Eosinophils Absolute: 0.1 10*3/uL (ref 0.0–0.7)
Eosinophils Relative: 2.5 % (ref 0.0–5.0)
HCT: 44.6 % (ref 39.0–52.0)
Hemoglobin: 14.9 g/dL (ref 13.0–17.0)
Lymphocytes Relative: 38.1 % (ref 12.0–46.0)
Lymphs Abs: 1.1 10*3/uL (ref 0.7–4.0)
MCHC: 33.4 g/dL (ref 30.0–36.0)
MCV: 95.3 fl (ref 78.0–100.0)
Monocytes Absolute: 0.4 10*3/uL (ref 0.1–1.0)
Monocytes Relative: 13.6 % — ABNORMAL HIGH (ref 3.0–12.0)
Neutro Abs: 1.3 10*3/uL — ABNORMAL LOW (ref 1.4–7.7)
Neutrophils Relative %: 44.8 % (ref 43.0–77.0)
Platelets: 204 10*3/uL (ref 150.0–400.0)
RBC: 4.68 Mil/uL (ref 4.22–5.81)
RDW: 13.1 % (ref 11.5–15.5)
WBC: 2.9 10*3/uL — ABNORMAL LOW (ref 4.0–10.5)

## 2023-09-03 LAB — LIPID PANEL
Cholesterol: 166 mg/dL (ref 0–200)
HDL: 50.3 mg/dL (ref >39.00)
LDL Cholesterol: 107 mg/dL — ABNORMAL HIGH (ref 0–99)
NonHDL: 115.65
Total CHOL/HDL Ratio: 3
Triglycerides: 44 mg/dL (ref 0.0–149.0)
VLDL: 8.8 mg/dL (ref 0.0–40.0)

## 2023-09-03 LAB — PSA: PSA: 0.63 ng/mL (ref 0.10–4.00)

## 2023-09-03 LAB — MICROALBUMIN / CREATININE URINE RATIO
Creatinine,U: 179.4 mg/dL
Microalb Creat Ratio: 5.9 mg/g (ref 0.0–30.0)
Microalb, Ur: 1.1 mg/dL (ref 0.0–1.9)

## 2023-09-03 MED ORDER — SILDENAFIL CITRATE 100 MG PO TABS: 50.0000 mg | ORAL_TABLET | Freq: Every day | ORAL | 1 refills | Status: AC | PRN

## 2023-09-03 NOTE — Patient Instructions (Addendum)
 It was great to see you!  We will do your pneumonia vaccine today  Please go to the lab for blood work.   Our office will call you with your results unless you have chosen to receive results via MyChart.  If your blood work is normal we will follow-up each year for physicals and as scheduled for chronic medical problems.  If anything is abnormal we will treat accordingly and get you in for a follow-up.  Take care,  Javier [name]

## 2023-09-03 NOTE — Progress Notes (Signed)
 Subjective:    Javier [name] is a 53 y.o. male and is here for a comprehensive physical exam.  HPI  Health Maintenance Due  Topic Date Due   HEMOGLOBIN A1C  08/27/2023    Acute Concerns: Pt is agreeable to getting his pneumonia vaccine today.  Chronic Issues: Diabetes Pt is currently not on any medication. He is down 8 lb from his visit in December, which he attributes to not eating or drinking for 12 hours while at work. Pt states he is not disciplined in eating and experiences cravings. He reports late night eating, as late as 12AM, and endorses eating a lot of meat and steak, rice, bread, and cheese. He will update his A1c today.  Erectile dysfunction Patient needs a refill on his sildenafil  50 to 100 mg daily as needed Denies concerns with this medication   Health Maintenance: Immunizations -- UTD, foot exam and A1c updated today. Colonoscopy -- UTD, last done 11/27/2020 with negative Cologuard. 3 year repeat recommended, next due 11/28/2023. PSA --  Lab Results  Component Value Date   PSA 0.74 02/26/2023   PSA 0.56 09/25/2021   PSA 0.59 11/21/2020   Diet -- Often eats a lot of meat, carbs, and cheese. Sleep habits -- Good sleeping habits, no concerns reported. Exercise -- Regular exercise habits.  Weight --  Recent weight history Wt Readings from Last 10 Encounters:  09/03/23 161 lb (73 kg)  02/26/23 169 lb (76.7 kg)  08/21/22 163 lb (73.9 kg)  04/10/22 164 lb 6.1 oz (74.6 kg)  12/24/21 165 lb 4 oz (75 kg)  11/16/21 160 lb 4 oz (72.7 kg)  10/16/21 157 lb (71.2 kg)  09/25/21 162 lb 3.2 oz (73.6 kg)  07/12/21 164 lb (74.4 kg)  07/05/21 159 lb (72.1 kg)   Body mass index is 25.22 kg/m.  Mood -- Stable. Alcohol use --  reports that he does not currently use alcohol.  Tobacco use --  Tobacco Use: Low Risk  (09/03/2023)   Patient History    Smoking Tobacco Use: Never    Smokeless Tobacco Use: Never    Passive Exposure: Not on file    Eligible for  Low Dose CT? no  UTD with eye doctor? UTD UTD with dentist? No, but he wants to see one.     09/03/2023   10:08 AM  Depression screen PHQ 2/9  Decreased Interest 0  Down, Depressed, Hopeless 0  PHQ - 2 Score 0    Other providers/specialists: Patient Care Team: Job Lukes, [place] as PCP - General (Physician Assistant)    PMHx, SurgHx, SocialHx, Medications, and Allergies were reviewed in the Visit Navigator and updated as appropriate.   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  HENT:  Negative for hearing loss, sinus pain and sore throat.   Respiratory:  Negative for cough and hemoptysis.   Cardiovascular:  Negative for chest pain, palpitations, leg swelling and PND.  Gastrointestinal:  Negative for abdominal pain, constipation, diarrhea, heartburn, nausea and vomiting.  Genitourinary:  Negative for dysuria, frequency and urgency.  Musculoskeletal:  Negative for back pain, myalgias and neck pain.  Skin:  Negative for itching and rash.  Neurological:  Negative for dizziness, tingling, seizures and headaches.  Endo/Heme/Allergies:  Negative for polydipsia.  Psychiatric/Behavioral:  Negative for depression. The patient is not nervous/anxious.      Objective:    Vitals:   09/03/23 1008  BP: 110/80  Pulse: 83  Temp: 98.8  F (37.1 C)  SpO2: 96%    Body mass index is 25.22 kg/m.  General  Alert, cooperative, no distress, appears stated age  Head:  Normocephalic, without obvious abnormality, atraumatic  Eyes:  PERRL, conjunctiva/corneas clear, EOM's intact, fundi benign, both eyes       Ears:  Normal TM's and external ear canals, both ears  Nose: Nares normal, septum midline, mucosa normal, no drainage or sinus tenderness  Throat: Lips, mucosa, and tongue normal; teeth and gums normal  Neck: Supple, symmetrical, trachea midline, no adenopathy;     thyroid :  No enlargement/tenderness/nodules; no carotid bruit or JVD  Back:   Symmetric, no curvature, ROM normal, no CVA tenderness  Lungs:   Clear to auscultation bilaterally, respirations unlabored  Chest wall:  No tenderness or deformity  Heart:  Regular rate and rhythm, S1 and S2 normal, no murmur, rub or gallop  Abdomen:   Soft, non-tender, bowel sounds active all four quadrants, no masses, no organomegaly  Extremities: Extremities normal, atraumatic, no cyanosis or edema  Prostate : Deferred   Skin: Skin color, texture, turgor normal, no rashes or lesions  Lymph nodes: Cervical, supraclavicular, and axillary nodes normal  Neurologic: CNII-XII grossly intact. Normal strength, sensation and reflexes throughout   AssessmentPlan:   Routine physical examination Today patient counseled on age appropriate routine health concerns for screening and prevention, each reviewed and up to date or declined. Immunizations reviewed and up to date or declined. Labs ordered and reviewed. Risk factors for depression reviewed and negative. Hearing function and visual acuity are intact. ADLs screened and addressed as needed. Functional ability and level of safety reviewed and appropriate. Education, counseling and referrals performed based on assessed risks today. Patient provided with a copy of personalized plan for preventive services.  Vitamin D  deficiency Update vitamin  D and provide recommendations   Type 2 diabetes mellitus without complication, without long-term current use of insulin (HCC) Update A1c and provide recommendations   Prostate cancer screening Update PSA   Erectile dysfunction, unspecified erectile dysfunction type Stable Refill as needed viagra   Need for prophylactic vaccination against Streptococcus pneumoniae (pneumococcus) Updated today   I, Lavern Simmers, acting as a Neurosurgeon for [employer], [place]., have documented all relevant documentation on the behalf of Lucie Buttner, [place], as directed by Lucie Buttner, PA while in the presence of Lucie Buttner, [place].  I, Lucie Buttner, [place], have reviewed all documentation for this visit. The documentation on 09/03/23 for the exam, diagnosis, procedures, and orders are all accurate and complete.  Lucie Buttner, PA-C [place] [facility]

## 2024-09-08 ENCOUNTER — Encounter: Admitting: Physician Assistant
